# Patient Record
Sex: Female | Born: 1970 | Race: Black or African American | Hispanic: No | Marital: Married | Smoking: Never smoker
Health system: Southern US, Community
[De-identification: ages and names within clinical notes are randomized; demographics above are authoritative.]

## PROBLEM LIST (undated history)

## (undated) DIAGNOSIS — N3020 Other chronic cystitis without hematuria: Secondary | ICD-10-CM

## (undated) HISTORY — DX: Other chronic cystitis without hematuria: N30.20

---

## 2016-12-18 ENCOUNTER — Ambulatory Visit (INDEPENDENT_AMBULATORY_CARE_PROVIDER_SITE_OTHER): Payer: 59 | Admitting: Family Medicine

## 2016-12-18 ENCOUNTER — Encounter: Payer: Self-pay | Admitting: Family Medicine

## 2016-12-18 VITALS — BP 107/69 | HR 61 | Temp 98.6°F | Resp 16 | Ht 69.0 in | Wt 180.6 lb

## 2016-12-18 DIAGNOSIS — D649 Anemia, unspecified: Secondary | ICD-10-CM | POA: Insufficient documentation

## 2016-12-18 DIAGNOSIS — N3010 Interstitial cystitis (chronic) without hematuria: Secondary | ICD-10-CM

## 2016-12-18 DIAGNOSIS — Z1329 Encounter for screening for other suspected endocrine disorder: Secondary | ICD-10-CM | POA: Insufficient documentation

## 2016-12-18 DIAGNOSIS — D509 Iron deficiency anemia, unspecified: Secondary | ICD-10-CM

## 2016-12-18 DIAGNOSIS — Z1239 Encounter for other screening for malignant neoplasm of breast: Secondary | ICD-10-CM

## 2016-12-18 DIAGNOSIS — Z1231 Encounter for screening mammogram for malignant neoplasm of breast: Secondary | ICD-10-CM

## 2016-12-18 DIAGNOSIS — Z7689 Persons encountering health services in other specified circumstances: Secondary | ICD-10-CM | POA: Diagnosis not present

## 2016-12-18 LAB — POCT URINALYSIS DIPSTICK
BILIRUBIN UA: NEGATIVE
Blood, UA: NEGATIVE
GLUCOSE UA: NEGATIVE
KETONES UA: NEGATIVE
LEUKOCYTES UA: NEGATIVE
Nitrite, UA: NEGATIVE
PROTEIN UA: NEGATIVE
SPEC GRAV UA: 1.01 (ref 1.010–1.025)
Urobilinogen, UA: 0.2 E.U./dL
pH, UA: 5 (ref 5.0–8.0)

## 2016-12-18 NOTE — Assessment & Plan Note (Signed)
History of anemia, no recent lab values Tolerating OTC iron daily Follow-up future CBC lab with annual physical 3 months

## 2016-12-18 NOTE — Assessment & Plan Note (Signed)
Stable chronic problem >9-10 years, previously dx in [place], do not have outside records. No recent flare or documented UTI. Limited relief in past.  Plan: 1. Check UA today, normal, will send for urine culture as well to confirm 2. Referral to [facility] for further eval and management of chronic interstitial cystitis, for any other etiology and assistance with symptom management 3. Follow-up as needed

## 2016-12-18 NOTE — Patient Instructions (Addendum)
Thank you for coming to the clinic today.  1. For your Chronic Interstitial Cystitis, I am referring you to local Urologist for further evaluation, they will likely need to repeat a Cystoscopy (bladder scope) since it has been >9-10 years.  We checked urine today, and it was normal. We will send it to lab for a Urine Culture, to make sure no bacteria grow. No antibiotics at this time.  If you want to continue AZO as needed that is fine. Discuss this and other treatments further with your Urologist  [facility] [address] [place] [id number] Phone: [phone]  [facility] [address] [place], [place] [address] Phone: [phone] Call anytime to schedule now that order has been placed.  You will be due for FASTING BLOOD WORK (no food or drink after midnight before, only water or coffee without cream/sugar on the morning of)  - Please go ahead and schedule a "Lab Only" visit in the morning at the clinic for lab draw in 5 months for October 2018, before next Annual Physical - Make sure Lab Only appointment is at least 1-2 weeks before your next appointment, so that results will be available  For Lab Results, once available within 2-3 days of blood draw, you can can log in to MyChart online to view your results and a brief explanation. Also, we can discuss results at next follow-up visit.  Please schedule a follow-up appointment with Dr. Althea CharonKaramalegos in 5 months for Annual Physical  If you have any other questions or concerns, please feel free to call the clinic or send a message through MyChart. You may also schedule an earlier appointment if necessary.  Saralyn PilarAlexander Karamalegos, DO [facility]

## 2016-12-18 NOTE — Assessment & Plan Note (Addendum)
Last mammogram approx 2016, reported negative Due for repeat screening now, no concerning family Ordered future mammogram, patient given contact info for [facility] to schedule in future before annual physical in 3 months

## 2016-12-18 NOTE — Progress Notes (Signed)
Subjective:    Patient ID: Anne Weeks, female    DOB: 1970-11-16, 46 y.o.   MRN: [id number]  Anne Weeks is a 45 y.o. female presenting on 12/18/2016 for Establish Care (pt is concerned about bladder- cystitis)   HPI   New patient. Moved from [place] with husband about 1 year ago, also has daughter. Other family in [place]. She was previously followed by PCP, Urology and GYN in [place], unsure if she can request records due to cost.  Chronic Interstitial Cystitis: - Reports prior history, initial diagnosed, 9-10 years ago in [place], had cystoscopy urology eval, no significant treatment found, she was treated with antibiotics, no relief - Tried OTC AZO in past for short courses with some soothing symptoms but has not used chronically - Denies any recent changes hematuria, worsening dysuria, fever/chills, sweats, nausea, vomiting, abdominal pain  History of Anemia: - Reports prior lab results, does not have available, taking OTC iron supplement daily  Health Maintenance: - No known peronsal or family history of breast cancer. Last mammogram 2 years ago, requesting repeat order for mammo screening. Currently asymptomatic - Last pap smear 05/2016, reported to be negative, unsure if HPV co testing or not, she prefers yearly pap smear done by PCP this 05/2017  Depression screen PHQ 2/9 12/18/2016  Decreased Interest 0  Down, Depressed, Hopeless 0  PHQ - 2 Score 0    Past Medical History:  Diagnosis Date  . Chronic cystitis    History reviewed. No pertinent surgical history. Social History   Social History  . Marital status: Married    Spouse name: N/A  . Number of children: N/A  . Years of education: N/A   Occupational History  . Not on file.   Social History Main Topics  . Smoking status: Never Smoker  . Smokeless tobacco: Never Used  . Alcohol use No  . Drug use: No  . Sexual activity: Not on file   Other Topics Concern  . Not on file   Social History Narrative    Born and raised in [place], moved to [place] recently in 2018 to be with husband   Family History  Problem Relation Age of Onset  . Diabetes Mother   . Hypertension Mother   . Cancer Father 23    lung  . Diabetes Sister    No current outpatient prescriptions on file prior to visit.   No current facility-administered medications on file prior to visit.     Review of Systems  Constitutional: Negative for activity change, appetite change, chills, diaphoresis, fatigue, fever and unexpected weight change.  HENT: Negative for congestion, hearing loss and sinus pressure.   Respiratory: Negative for cough and shortness of breath.   Gastrointestinal: Negative for abdominal pain, constipation, diarrhea, nausea and vomiting.  Genitourinary: Positive for dysuria. Negative for difficulty urinating, frequency, hematuria, pelvic pain and urgency.  Skin: Negative for rash.   Per HPI unless specifically indicated above     Objective:    BP 107/69   Pulse 61   Temp 98.6 F (37 C) (Oral)   Resp 16   Ht 5\' 9"  (1.753 m)   Wt 180 lb 9.6 oz (81.9 kg)   LMP 11/27/2016   BMI 26.67 kg/m   Wt Readings from Last 3 Encounters:  12/18/16 180 lb 9.6 oz (81.9 kg)    Physical Exam  Constitutional: She appears well-developed and well-nourished. No distress.  Well-appearing, comfortable, cooperative  HENT:  Head: Normocephalic and atraumatic.  Mouth/Throat: Oropharynx is  clear and moist.  Eyes: Conjunctivae are normal. Right eye exhibits no discharge. Left eye exhibits no discharge.  Neck: Normal range of motion. Neck supple.  Cardiovascular: Normal rate, regular rhythm, normal heart sounds and intact distal pulses.   No murmur heard. Pulmonary/Chest: Effort normal and breath sounds normal. No respiratory distress. She has no wheezes. She has no rales.  Abdominal: Soft. Bowel sounds are normal. She exhibits no distension. There is no tenderness.  Musculoskeletal: Normal range of motion. She exhibits  no edema.  Neurological: She is alert.  Skin: Skin is warm and dry. No rash noted. She is not diaphoretic. No erythema.  Psychiatric: She has a normal mood and affect. Her behavior is normal.  Nursing note and vitals reviewed.  Results for orders placed or performed in visit on 12/18/16  POCT Urinalysis Dipstick  Result Value Ref Range   Color, UA amber    Clarity, UA clear    Glucose, UA negative    Bilirubin, UA negative    Ketones, UA negative    Spec Grav, UA 1.010 1.010 - 1.025   Blood, UA negative    pH, UA 5.0 5.0 - 8.0   Protein, UA negative    Urobilinogen, UA 0.2 0.2 or 1.0 E.U./dL   Nitrite, UA negative    Leukocytes, UA Negative Negative      Assessment & Plan:   Problem List Items Addressed This Visit    Screening for breast cancer    Last mammogram approx 2016, reported negative Due for repeat screening now, no concerning family Ordered future mammogram, patient given contact info for [facility] to [facility] in future before annual physical in 3 months      Relevant Orders   MM DIGITAL SCREENING BILATERAL   Chronic interstitial cystitis - Primary    Stable chronic problem >9-10 years, previously dx in [place], do not have outside records. No recent flare or documented UTI. Limited relief in past.  Plan: 1. Check UA today, normal, will send for urine culture as well to confirm 2. Referral to [facility] for further eval and management of chronic interstitial cystitis, for any other etiology and assistance with symptom management 3. Follow-up as needed      Relevant Orders   POCT Urinalysis Dipstick (Completed)   Ambulatory referral to Urology   Urine culture   Anemia    History of anemia, no recent lab values Tolerating OTC iron daily Follow-up future CBC lab with annual physical 3 months      Relevant Medications   ferrous sulfate 325 (65 FE) MG EC tablet    Other Visit Diagnoses    Encounter to establish care with new doctor            Meds ordered this encounter  Medications  . Multiple Vitamin (MULTIVITAMIN) tablet    Sig: Take 1 tablet by mouth daily.  . ferrous sulfate 325 (65 FE) MG EC tablet    Sig: Take 325 mg by mouth 3 (three) times daily with meals.  . Fish Oil-Cholecalciferol (FISH OIL + D3 PO)    Sig: Take by mouth.      Follow up plan: Return in about 5 months (around 05/20/2017) for Annual Physical.  Saralyn PilarAlexander Karamalegos, DO [facility] 12/18/2016, 8:48 PM

## 2016-12-19 ENCOUNTER — Other Ambulatory Visit: Payer: Self-pay | Admitting: Family Medicine

## 2016-12-19 DIAGNOSIS — Z131 Encounter for screening for diabetes mellitus: Secondary | ICD-10-CM

## 2016-12-19 DIAGNOSIS — Z13220 Encounter for screening for lipoid disorders: Secondary | ICD-10-CM

## 2016-12-19 DIAGNOSIS — Z0000 Encounter for general adult medical examination without abnormal findings: Secondary | ICD-10-CM

## 2016-12-19 DIAGNOSIS — D509 Iron deficiency anemia, unspecified: Secondary | ICD-10-CM

## 2016-12-20 LAB — URINE CULTURE

## 2017-01-15 DIAGNOSIS — R300 Dysuria: Secondary | ICD-10-CM | POA: Insufficient documentation

## 2017-01-15 NOTE — Progress Notes (Signed)
01/16/2017 8:34 AM   Anne Weeks 03-15-1971 [id number]  Referring provider: Smitty CordsKaramalegos, Anne J, DO [address] [place], [place] [address]  No chief complaint on file.   HPI:  1. Culture Negative Dysuria - Pt with years of culture negative dysuria. Had cysto that was normal per report in [place] and in [place]. No gross hematuria. UCX negative x many previously. UA normal 2018. Pelvic 01/2017 unremarkable. PVR 01/2017 "0mL" (normal). Some help with OTC urinary analgesics such as AZO.  PMH otherwise unremarkable. Her PCP is Anne HartiganAlex Weeks with Anne Weeks.   Today "Anne Weeks" is seen as new patient for above. She is referred by Anne Weeks.    PMH: Past Medical History:  Diagnosis Date  . Chronic cystitis     Surgical History: No past surgical history on file.  Home Medications:  Allergies as of 01/16/2017   No Known Allergies     Medication List       Accurate as of 01/15/17  8:34 AM. Always use your most recent med list.          ferrous sulfate 325 (65 FE) MG EC tablet Take 325 mg by mouth 3 (three) times daily with meals.   FISH OIL + D3 PO Take by mouth.   multivitamin tablet Take 1 tablet by mouth daily.       Allergies: No Known Allergies  Family History: Family History  Problem Relation Age of Onset  . Diabetes Mother   . Hypertension Mother   . Cancer Father [id number]       lung  . Diabetes Sister     Social History:  reports that she has never smoked. She has never used smokeless tobacco. She reports that she does not drink alcohol or use drugs.    Review of Systems  Gastrointestinal (upper)  : Negative for upper GI symptoms  Gastrointestinal (lower) : Negative for lower GI symptoms  Constitutional : Negative for symptoms  Skin: Negative for skin symptoms  Eyes: Negative for eye symptoms  Ear/Nose/Throat : Negative for Ear/Nose/Throat symptoms  Hematologic/Lymphatic: Negative for Hematologic/Lymphatic  symptoms  Cardiovascular : Negative for cardiovascular symptoms  Respiratory : Negative for respiratory symptoms  Endocrine: Negative for endocrine symptoms  Musculoskeletal: Negative for musculoskeletal symptoms  Neurological: Negative for neurological symptoms  Psychologic: Negative for psychiatric symptoms   Physical Exam: There were no vitals taken for this visit.  Constitutional:  Alert and oriented, No acute distress. HEENT: [place] AT, moist mucus membranes.  Trachea midline, no masses. Cardiovascular: No clubbing, cyanosis, or edema. Respiratory: Normal respiratory effort, no increased work of breathing. GI: Abdomen is soft, nontender, nondistended, no abdominal masses GU: No CVA tenderness. Well estrogenized vagina / entoritus w/o mass. No palpable bladder or pelvic masses. Age-appropriate cervix, overies by bimanual exam. Anne Weeks as chaparone.  Skin: No rashes, bruises or suspicious lesions. Lymph: No cervical or inguinal adenopathy. Neurologic: Grossly intact, no focal deficits, moving all 4 extremities. Psychiatric: Normal mood and affect.  Laboratory Data: No results found for: WBC, HGB, HCT, MCV, PLT  No results found for: CREATININE  No results found for: PSA  No results found for: TESTOSTERONE  No results found for: HGBA1C  Urinalysis    Component Value Date/Time   BILIRUBINUR negative 12/18/2016 1503   PROTEINUR negative 12/18/2016 1503   UROBILINOGEN 0.2 12/18/2016 1503   NITRITE negative 12/18/2016 1503   LEUKOCYTESUR Negative 12/18/2016 1503    Pertinent Imaging: Bladder scan reviewed as per HPI  Assessment &  Plan:    1. Culture Negative Dysuria - rec PRN OTC Azo. Discussed that this is chronic problem best served by stress reduction and prn OTC urinary analgesics. She has no hematuria or worrisoem findings on exam. She empties completely. Also reinforced we to NOT prescribe opiods / psychotropic meds for this indication.  Consider repeat  cysto to r/o neoplasia or Hunner's disease if becomes refractory, though this too has been normal several times prior.  RTC Urol PRN.    Anne Ache, MD  [facility] [address], [address] [place], [place] [address] [phone]

## 2017-01-16 ENCOUNTER — Encounter: Payer: Self-pay | Admitting: Urology

## 2017-01-16 ENCOUNTER — Ambulatory Visit (INDEPENDENT_AMBULATORY_CARE_PROVIDER_SITE_OTHER): Payer: 59 | Admitting: Urology

## 2017-01-16 DIAGNOSIS — R300 Dysuria: Secondary | ICD-10-CM | POA: Diagnosis not present

## 2017-01-16 LAB — URINALYSIS, COMPLETE
Bilirubin, UA: NEGATIVE
Glucose, UA: NEGATIVE
KETONES UA: NEGATIVE
Leukocytes, UA: NEGATIVE
NITRITE UA: NEGATIVE
PH UA: 5.5 (ref 5.0–7.5)
Protein, UA: NEGATIVE
Specific Gravity, UA: 1.02 (ref 1.005–1.030)
Urobilinogen, Ur: 0.2 mg/dL (ref 0.2–1.0)

## 2017-01-16 LAB — MICROSCOPIC EXAMINATION: RBC MICROSCOPIC, UA: NONE SEEN /HPF (ref 0–?)

## 2017-01-16 LAB — BLADDER SCAN AMB NON-IMAGING: Scan Result: 0

## 2017-01-17 ENCOUNTER — Ambulatory Visit: Payer: Self-pay

## 2017-03-12 ENCOUNTER — Ambulatory Visit (INDEPENDENT_AMBULATORY_CARE_PROVIDER_SITE_OTHER): Payer: No Typology Code available for payment source | Admitting: Obstetrics and Gynecology

## 2017-03-12 ENCOUNTER — Encounter: Payer: Self-pay | Admitting: Obstetrics and Gynecology

## 2017-03-12 VITALS — BP 114/80 | HR 99 | Ht 68.0 in | Wt 188.0 lb

## 2017-03-12 DIAGNOSIS — N912 Amenorrhea, unspecified: Secondary | ICD-10-CM | POA: Diagnosis not present

## 2017-03-12 DIAGNOSIS — O200 Threatened abortion: Secondary | ICD-10-CM

## 2017-03-12 LAB — POCT URINE PREGNANCY: Preg Test, Ur: POSITIVE — AB

## 2017-03-12 NOTE — Progress Notes (Signed)
Obstetric Problem Visit   Chief Complaint:  Chief Complaint  Patient presents with  . OB bleeding    spotting brown/pink in morning x4 days/pt refused to get urine/brought own pregnancy test    History of Present Illness: Patient is a [age] y.o. G3P1011 at approximately 10 weeks 2 days gestation by LMP presenting for first trimester bleeding.  The onset of bleeding was 1 week ago.  Is bleeding equal to or greater than normal menstrual flow:  No Any recent trauma:  No Recent intercourse:  No History of prior miscarriage:  Yes Prior ultrasound demonstrating IUP:  No Prior ultrasound demonstrating viable IUP:  No Prior Serum HCG:  No Rh status: reports positive  Some mild cramping.  Review of Systems: Review of Systems  Constitutional: Negative for chills, fever and malaise/fatigue.  Gastrointestinal: Negative for abdominal pain, nausea and vomiting.  Psychiatric/Behavioral: The patient is nervous/anxious.     Past Medical History:  Past Medical History:  Diagnosis Date  . Chronic cystitis     Past Surgical History:  History reviewed. No pertinent surgical history.  Gynecologic History:  Patient's last menstrual period was 12/30/2016 (exact date). Contraception: none   Obstetric History: G3P1011  Family History:  Family History  Problem Relation Age of Onset  . Diabetes Mother   . Hypertension Mother   . Cancer Father [phone]       lung  . Diabetes Sister   . Bladder Cancer Neg Hx   . Kidney cancer Neg Hx     Social History:  Social History   Social History  . Marital status: Married    Spouse name: N/A  . Number of children: N/A  . Years of education: N/A   Occupational History  . Not on file.   Social History Main Topics  . Smoking status: Never Smoker  . Smokeless tobacco: Never Used  . Alcohol use No  . Drug use: No  . Sexual activity: Yes    Birth control/ protection: None   Other Topics Concern  . Not on file   Social History Narrative   Born  and raised in [place], moved [place] recently in [place] to be with husband    Allergies:  No Known Allergies  Medications: Prior to Admission medications   Not on File    Physical Exam Vitals: Blood pressure 114/80, pulse 99, height 5\' 8"  (1.727 m), weight 188 lb (85.3 kg), last menstrual period 12/30/2016. General: NAD HEENT: normocephalic, anicteric Pulmonary: No increased work of breathing, Abdomen: NABS, soft, non-tender, non-distended.  Umbilicus without lesions.  No hepatomegaly, splenomegaly or masses palpable. No evidence of hernia  Genitourinary:  External: Normal external female genitalia.  Normal urethral meatus, normal  Bartholin's and Skene's glands.    Vagina: Normal vaginal mucosa, no evidence of prolapse.    Cervix: closed  Uterus:non-tender, non-enlarged  Adnexa: ovaries non-enlarged, no adnexal masses  Rectal: deferred Extremities: no edema, erythema, or tenderness Neurologic: Grossly intact Psychiatric: mood appropriate, affect full  Assessment: 46 y.o. G3P1011 threatened miscarriage  Plan: Problem List Items Addressed This Visit    None    Visit Diagnoses    Amenorrhea    -  Primary   Relevant Orders   POCT urine pregnancy (Completed)   US OB Comp Less 14 Wks   Threatened abortion       Relevant Orders   US OB Comp Less 14 Wks      1) First trimester bleeding - incidence and clinical course of first trimester  bleeding is discussed in detail with the patient today.  Approximately 1/3 of pregnancies ending in live births experienced 1st trimester bleeding.  The amount of bleeding is variable and not necessarily predictive of outcome.  Sources may be cervical or uterine.  Subchorionic hemorrhages are a frequent concurrent findings on ultrasound and are followed expectantly.  These often absorb or regress spontaneously although risk for expansion and further disruption of the utero-placental interface leading to miscarriage is possible.  There is no clearly  documented benefit to limiting or modifying activity and sexual intercourse in altering clinic course of 1st trimester bleeding.    2) If no already done will proceed with TVUS evaluation to document viability, and if uncertain viability or absence of a demonstrable IUP (and no previous documentation of IUP) will trend HCG levels.  3) The patient is Rh positive per patient report  4) Routine bleeding precautions were discussed with the patient prior the conclusion of today's visit.   Vena AustriaAndreas Rose-Marie Hickling, MD, Merlinda FrederickFACOG [facility], [facility]

## 2017-03-14 ENCOUNTER — Ambulatory Visit (INDEPENDENT_AMBULATORY_CARE_PROVIDER_SITE_OTHER): Payer: No Typology Code available for payment source | Admitting: Obstetrics and Gynecology

## 2017-03-14 ENCOUNTER — Ambulatory Visit (INDEPENDENT_AMBULATORY_CARE_PROVIDER_SITE_OTHER): Payer: Self-pay

## 2017-03-14 ENCOUNTER — Other Ambulatory Visit: Payer: Self-pay | Admitting: Obstetrics and Gynecology

## 2017-03-14 VITALS — BP 108/68 | Wt 184.0 lb

## 2017-03-14 DIAGNOSIS — O021 Missed abortion: Secondary | ICD-10-CM

## 2017-03-14 DIAGNOSIS — N912 Amenorrhea, unspecified: Secondary | ICD-10-CM

## 2017-03-14 DIAGNOSIS — O200 Threatened abortion: Secondary | ICD-10-CM

## 2017-03-14 MED ORDER — OXYCODONE-ACETAMINOPHEN 5-325 MG PO TABS: 1.0000 | ORAL_TABLET | ORAL | 0 refills | Status: DC | PRN

## 2017-03-14 MED ORDER — IBUPROFEN 800 MG PO TABS
800.0000 mg | ORAL_TABLET | Freq: Three times a day (TID) | ORAL | 1 refills | Status: DC | PRN
Start: 2017-03-14 — End: 2017-07-20

## 2017-03-14 MED ORDER — MISOPROSTOL 200 MCG PO TABS: ORAL_TABLET | ORAL | 0 refills | Status: DC

## 2017-03-14 NOTE — Patient Instructions (Signed)
[email or web address] 

## 2017-03-14 NOTE — Progress Notes (Signed)
Gynecology Ultrasound Follow Up  Chief Complaint:  Chief Complaint  Patient presents with  . U/S follow     History of Present Illness: Patient is a 46 y.o. G3P102711female who presents today for ultrasound evaluation of first trimester bleeding.  Ultrasound demonstrates a [redacted] week gestation missed abortion.  Otherwise normal no clearly identifiable etiology for patient's miscarriage.  The patient states she is known to be Rh negative per her prior pregnancy.  Review of Systems: Review of Systems  Constitutional: Negative for chills and fever.  Gastrointestinal: Negative for abdominal pain.    Past Medical History:  Past Medical History:  Diagnosis Date  . Chronic cystitis     Past Surgical History:  No past surgical history on file.  Gynecologic History:  Patient's last menstrual period was 12/30/2016 (exact date).  Family History:  Family History  Problem Relation Age of Onset  . Diabetes Mother   . Hypertension Mother   . Cancer Father [id number]       lung  . Diabetes Sister   . Bladder Cancer Neg Hx   . Kidney cancer Neg Hx     Social History:  Social History   Social History  . Marital status: Married    Spouse name: N/A  . Number of children: N/A  . Years of education: N/A   Occupational History  . Not on file.   Social History Main Topics  . Smoking status: Never Smoker  . Smokeless tobacco: Never Used  . Alcohol use No  . Drug use: No  . Sexual activity: Yes    Birth control/ protection: None   Other Topics Concern  . Not on file   Social History Narrative   Born [place], moved [place] recently in [place] to be with husband    Allergies:  No Known Allergies  Medications: Prior to Admission medications   Medication Sig Start Date End Date Taking? Authorizing Provider  ibuprofen (ADVIL,MOTRIN) 800 MG tablet Take 1 tablet (800 mg total) by mouth every 8 (eight) hours as needed. 03/14/17   Vena AustriaStaebler, Kiante Ciavarella, MD  misoprostol (CYTOTEC)  200 MCG tablet Place four tablets in between your gums and cheeks (two tablets on each side) as instructed OR insert four tablets vaginally, repeat every 6-hrs as needed 03/14/17   Vena AustriaStaebler, Yves Fodor, MD  oxyCODONE-acetaminophen (PERCOCET/ROXICET) 5-325 MG tablet Take 1-2 tablets by mouth every 4 (four) hours as needed. 03/14/17   Vena AustriaStaebler, Tarisa Paola, MD    Physical Exam Vitals: Blood pressure 108/68, weight 184 lb (83.5 kg), last menstrual period 12/30/2016.  General: NAD HEENT: normocephalic, anicteric Pulmonary: No increased work of breathing Extremities: no edema, erythema, or tenderness Neurologic: Grossly intact, normal gait Psychiatric: mood appropriate, affect full   Assessment: 46 y.o. G3P1011 missed abortion  Plan: Problem List Items Addressed This Visit    None    Visit Diagnoses    Missed abortion    -  Primary    "Society of [employer]iology[employer] in Ultrasound Guidelines for Transvaginal Ultrasonographic Diagnosis of Early Pregnancy Loss" and adopted in ACOG Practice Bulletin Number 150, May 2015 (reaffirmed 2017) "Early Pregnancy Loss"  - CRL of 7mm or greater and absence of fetal heartbeat  - Mean sac diameter of 25mm or greater and no embryo  - Absence of embryo with a discernable heartbeat 2 week after initial ultrasound showing a gestational sac without a yolk sac  - Absence of embryo with a discernable heartbeat 11 days after initial ultrasound showing a gestational  sac with  yolk sac present   Condolences were offered to the patient and her family.  I stressed that while emotionally difficult, that this did not occur because of an actions or inactions by the patient.  Somewhere between 10-20% of identified first trimester pregnancies will unfortunately end in miscarriage.  Given this relatively high incidence rate, further diagnostic testing such as chromosome analysis is generally not clinically relevant nor recommended.  Although the chromosomal abnormalities have been  implicated at rates as high as 70% in some studies, these are generally random and do not infer and increased risk of recurrence with subsequent pregnancies.  However, 3 or more consecutive first trimester losses are relatively uncommon, and these patient generally do benefit from additional work up to determine a potential modifiable etiology.   We briefly discussed management options including expectant management, medical management, and surgical management as well as their relative success rates and complications. Approximately 80% of first trimester miscarriages will pass successfully but may require a time frame of up to 8 weeks (ACOG Practice Bulletin 150 May 2015 "Early Pregnancy Loss").  Medical management using 800mcg of misoprostil administered every 3-hrs as needed for up to 3 doses speeds up the time frame to completion significantly, has literature supporting its use up to 63 days or 4730w0d gestation and results in a passage rate of 84-85% (ACOG Practice Bulletin 143 March 2014 "Medical Management of First-Trimester Abortion").  Dilation and curettage has the highest rate of uterine evacuation, but carries with is operative cost, surgical and anesthetic risk.  While these risk are relatively small they nevertheless include infection, bleeding, uterine perforation, formation of uterine synechia, and in rare cases death.   We discussed repeat ultrasound and or trending HCG levels if the patient wishes to pursue these prior to making her decision.  Clinically I am confident of the diagnosis, but I do not want any doubts in the patient's mind regarding the plan of management she chooses to adopt.  I will allow the patient and her family to discuss management options and she was advised to contact the office to arrange final disposition one she has made her decision or should she have any follow up questions for myself.    The patient is Rh positive and rhogam is therefore not ndicated.   - The patient  who is accompanied by her husband today wants to proceed with medical management A total of 15 minutes were spent in face-to-face contact with the patient during this encounter with over half of that time devoted to counseling and coordination of care.

## 2017-03-21 ENCOUNTER — Encounter: Payer: Self-pay | Admitting: Obstetrics and Gynecology

## 2017-03-21 ENCOUNTER — Ambulatory Visit (INDEPENDENT_AMBULATORY_CARE_PROVIDER_SITE_OTHER): Payer: No Typology Code available for payment source | Admitting: Obstetrics and Gynecology

## 2017-03-21 VITALS — BP 104/66 | HR 64 | Wt 191.0 lb

## 2017-03-21 DIAGNOSIS — O039 Complete or unspecified spontaneous abortion without complication: Secondary | ICD-10-CM | POA: Diagnosis not present

## 2017-03-21 NOTE — Progress Notes (Signed)
Gynecology Follow Up  Chief Complaint:  Chief Complaint  Patient presents with  . Follow-up    SAB     History of Present Illness: Patient is a 46 y.o. female who presents today for follow up of medically managed first trimester missed abortion.  The patient states she took her cytotec as directed, reports passage of tissue, with subsequent decrease in bleeding and cramping.  She currently is having no cramping, light bleeding slightly less to approximately equal to menses.  She states she has dealt well with the miscarriage emotionally.  She is not interested in attempting to conceive again in the near future and is interested in contraceptive options.    Review of Systems: Review of Systems  Constitutional: Negative for chills and fever.  Gastrointestinal: Negative for abdominal pain.  Psychiatric/Behavioral: Negative for depression. The patient is not nervous/anxious and does not have insomnia.     Past Medical History:  Past Medical History:  Diagnosis Date  . Chronic cystitis     Past Surgical History:  No past surgical history on file.  Gynecologic History:  Patient's last menstrual period was 12/30/2016 (exact date). Contraception: none currently  Family History:  Family History  Problem Relation Age of Onset  . Diabetes Mother   . Hypertension Mother   . Cancer Father [id number]       lung  . Diabetes Sister   . Bladder Cancer Neg Hx   . Kidney cancer Neg Hx     Social History:  Social History   Social History  . Marital status: Married    Spouse name: N/A  . Number of children: N/A  . Years of education: N/A   Occupational History  . Not on file.   Social History Main Topics  . Smoking status: Never Smoker  . Smokeless tobacco: Never Used  . Alcohol use No  . Drug use: No  . Sexual activity: Yes    Birth control/ protection: None   Other Topics Concern  . Not on file   Social History Narrative   Born [place] raised in [place], moved [place] recently  in [place] to be with husband    Allergies:  No Known Allergies  Medications: Prior to Admission medications   Medication Sig Start Date End Date Taking? Authorizing Provider  ibuprofen (ADVIL,MOTRIN) 800 MG tablet Take 1 tablet (800 mg total) by mouth every 8 (eight) hours as needed. 03/14/17   Vena AustriaStaebler, Tyreque Finken, MD  misoprostol (CYTOTEC) 200 MCG tablet Place four tablets in between your gums and cheeks (two tablets on each side) as instructed OR insert four tablets vaginally, repeat every 6-hrs as needed 03/14/17   Vena AustriaStaebler, Linsey Hirota, MD  oxyCODONE-acetaminophen (PERCOCET/ROXICET) 5-325 MG tablet Take 1-2 tablets by mouth every 4 (four) hours as needed. 03/14/17   Vena AustriaStaebler, Keiley Levey, MD    Physical Exam Vitals: Blood pressure 104/66, pulse 64, weight 191 lb (86.6 kg), last menstrual period 12/30/2016.  General: NAD HEENT: normocephalic, anicteric Pulmonary: No increased work of breathing Abdomen: Soft, non-tender, bedside ultrasound reveals a distended bladder making visualization of the uterus suboptimal but the previously seen gestational sac is no longer visualized.  Extremities: no edema, erythema, or tenderness Neurologic: Grossly intact, normal gait Psychiatric: mood appropriate, affect full   Assessment: 46 y.o. G3P1011 completed abortion  Plan: Problem List Items Addressed This Visit    None    Visit Diagnoses    Completed inevitable abortion    -  Primary      1)  Successful completion of missed abortion with medical management 2) We discussed contraceptive options available at her age including progestin only options such as pills, depo provera, or IUD.  Surgical sterilization via vasectomy or tubal ligation are also viable options.  She will consider her options and contact the office once she has made a final decision. 3) A total of 15 minutes were spent in face-to-face contact with the patient during this encounter with over half of that time devoted to counseling and  coordination of care.

## 2017-03-21 NOTE — Patient Instructions (Signed)
Contraceptive options 1) Progesterone only pill 2) Depo provera shot every 3 months 3) Nexplanon 4) IUD (Mirena)

## 2017-05-16 ENCOUNTER — Other Ambulatory Visit: Payer: Self-pay

## 2017-05-21 ENCOUNTER — Encounter: Payer: Self-pay | Admitting: Family Medicine

## 2017-07-20 ENCOUNTER — Encounter: Payer: Self-pay | Admitting: Family Medicine

## 2017-07-20 ENCOUNTER — Ambulatory Visit: Payer: Self-pay | Admitting: Family Medicine

## 2017-07-20 VITALS — BP 108/57 | HR 68 | Temp 98.5°F | Ht 68.0 in | Wt 176.0 lb

## 2017-07-20 DIAGNOSIS — S46812A Strain of other muscles, fascia and tendons at shoulder and upper arm level, left arm, initial encounter: Secondary | ICD-10-CM

## 2017-07-20 MED ORDER — CYCLOBENZAPRINE HCL 10 MG PO TABS: 10.0000 mg | ORAL_TABLET | Freq: Two times a day (BID) | ORAL | 0 refills | Status: DC | PRN

## 2017-07-20 MED ORDER — NAPROXEN 500 MG PO TABS: 500.0000 mg | ORAL_TABLET | Freq: Two times a day (BID) | ORAL | 0 refills | Status: DC
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
## 2018-02-06 ENCOUNTER — Ambulatory Visit: Payer: No Typology Code available for payment source | Admitting: Obstetrics and Gynecology

## 2018-05-15 ENCOUNTER — Ambulatory Visit (INDEPENDENT_AMBULATORY_CARE_PROVIDER_SITE_OTHER): Payer: BLUE CROSS/BLUE SHIELD | Admitting: Obstetrics and Gynecology

## 2018-05-15 ENCOUNTER — Other Ambulatory Visit (HOSPITAL_COMMUNITY)
Admission: RE | Admit: 2018-05-15 | Discharge: 2018-05-15 | Disposition: A | Discharge status: Home / Self Care | Payer: No Typology Code available for payment source | Source: Ambulatory Visit | Attending: Obstetrics and Gynecology | Admitting: Obstetrics and Gynecology

## 2018-05-15 ENCOUNTER — Encounter: Payer: Self-pay | Admitting: Obstetrics and Gynecology

## 2018-05-15 VITALS — BP 106/60 | HR 68 | Wt 174.0 lb

## 2018-05-15 DIAGNOSIS — Z113 Encounter for screening for infections with a predominantly sexual mode of transmission: Secondary | ICD-10-CM

## 2018-05-15 DIAGNOSIS — N760 Acute vaginitis: Secondary | ICD-10-CM

## 2018-05-15 DIAGNOSIS — Z124 Encounter for screening for malignant neoplasm of cervix: Secondary | ICD-10-CM

## 2018-05-15 DIAGNOSIS — Z3009 Encounter for other general counseling and advice on contraception: Secondary | ICD-10-CM

## 2018-05-15 MED ORDER — NORETHINDRONE 0.35 MG PO TABS: 1.0000 | ORAL_TABLET | Freq: Every day | ORAL | 11 refills | Status: DC

## 2018-05-15 NOTE — Progress Notes (Signed)
Obstetrics & Gynecology Office Visit   Chief Complaint:  Chief Complaint  Patient presents with  . Discuss birthcontrol  . Vaginal Itching    discharge w/slight odor    History of Present Illness: Patient is a 47 y.o. [id number] presenting for contraception consult.  She is currently on none and desiring to start oral progesterone-only contraceptive.  She has a past medical history significant for no contraindication to estrogen.  She specifically denies a history of migraine with aura, chronic hypertension, history of DVT/PE and smoking.  Reported Patient's last menstrual period was 04/24/2018 (exact date)..    She does also report vaginal irritation and discharge with odor.   Review of Systems: .Review of Systems  Constitutional: Negative.   Gastrointestinal: Negative.   Genitourinary: Negative.   Neurological: Positive for headaches.    Past Medical History:  Past Medical History:  Diagnosis Date  . Chronic cystitis     Past Surgical History:  History reviewed. No pertinent surgical history.  Gynecologic History: Patient's last menstrual period was 04/24/2018 (exact date).  Obstetric History: G3P1011  Family History:  Family History  Problem Relation Age of Onset  . Diabetes Mother   . Hypertension Mother   . Cancer Father 68       lung  . Diabetes Sister   . Bladder Cancer Neg Hx   . Kidney cancer Neg Hx     Social History:  Social History   Socioeconomic History  . Marital status: Married    Spouse name: Not on file  . Number of children: Not on file  . Years of education: Not on file  . Highest education level: Not on file  Occupational History  . Not on file  Social Needs  . Financial resource strain: Not on file  . Food insecurity:    Worry: Not on file    Inability: Not on file  . Transportation needs:    Medical: Not on file    Non-medical: Not on file  Tobacco Use  . Smoking status: Never Smoker  . Smokeless tobacco: Never Used    Substance and Sexual Activity  . Alcohol use: No  . Drug use: No  . Sexual activity: Yes    Birth control/protection: None  Lifestyle  . Physical activity:    Days per week: Not on file    Minutes per session: Not on file  . Stress: Not on file  Relationships  . Social connections:    Talks on phone: Not on file    Gets together: Not on file    Attends religious service: Not on file    Active member of club or organization: Not on file    Attends meetings of clubs or organizations: Not on file    Relationship status: Not on file  . Intimate partner violence:    Fear of current or ex partner: Not on file    Emotionally abused: Not on file    Physically abused: Not on file    Forced sexual activity: Not on file  Other Topics Concern  . Not on file  Social History Narrative   Born and raised in [place], moved to [place] recently in 2018 to be with husband    Allergies:  No Known Allergies  Medications: Prior to Admission medications   Not on File    Physical Exam Vitals:  Vitals:   05/15/18 0839  BP: 106/60  Pulse: 68   Patient's last menstrual period was 04/24/2018 (exact date).  General: NAD HEENT: normocephalic, anicteric Pulmonary: No increased work of breathing Neurologic: Grossly intact Psychiatric: mood appropriate, affect full  Wet Prep: PH: <4.5 Clue Cells: Negative Fungal elements: Negative Trichomonas: Negative   Assessment: 47 y.o. [id number] presenting for contraception consult  Plan: Problem List Items Addressed This Visit    None    Visit Diagnoses    Acute vaginitis    -  Primary   Relevant Orders   Cytology - PAP   Routine screening for STI (sexually transmitted infection)       Relevant Orders   Cytology - PAP   Screening for malignant neoplasm of cervix       Relevant Orders   Cytology - PAP   Encounter for counseling regarding contraception         - Rx camilla  - negative wet mount - pap collected - A total of 15 minutes  were spent in face-to-face contact with the patient during this encounter with over half of that time devoted to counseling and coordination of care. - Return in about 1 year (around 05/16/2019) for annual.   Vena Austria, MD, [facility], [facility] 05/15/2018, 9:12 AM

## 2018-05-17 LAB — CYTOLOGY - PAP
ADEQUACY: ABSENT — AB
Chlamydia: NEGATIVE
HPV (WINDOPATH): NOT DETECTED
NEISSERIA GONORRHEA: NEGATIVE

## 2018-05-30 ENCOUNTER — Encounter: Payer: Self-pay | Admitting: Obstetrics and Gynecology

## 2018-05-30 ENCOUNTER — Telehealth: Payer: Self-pay | Admitting: Obstetrics and Gynecology

## 2018-05-30 NOTE — Telephone Encounter (Signed)
Patient is calling for labs results. Please advise. Anytime after 5:15 please call

## 2018-05-31 NOTE — Telephone Encounter (Signed)
Pt met me in the parking lot this morning at 7:30. She is worried because she missed a call from [place]. She is aware AMS is out of the office today. I told her I would look into the reason he called.  AMS I see her pap is abnormal. Is there anything I can tell her or would you like to call her?

## 2018-05-31 NOTE — Telephone Encounter (Signed)
Pt is calling again for results of pap smear.

## 2018-06-03 NOTE — Telephone Encounter (Signed)
Patient is calling for her lab results. Please advise.

## 2019-04-08 ENCOUNTER — Other Ambulatory Visit: Payer: Self-pay | Admitting: Obstetrics and Gynecology

## 2019-04-08 NOTE — Telephone Encounter (Signed)
Pt needs annual in October

## 2019-05-23 ENCOUNTER — Other Ambulatory Visit (HOSPITAL_COMMUNITY)
Admission: RE | Admit: 2019-05-23 | Discharge: 2019-05-23 | Disposition: A | Discharge status: Home / Self Care | Payer: BC Managed Care – PPO | Source: Ambulatory Visit | Attending: Obstetrics and Gynecology | Admitting: Obstetrics and Gynecology

## 2019-05-23 ENCOUNTER — Encounter: Payer: Self-pay | Admitting: Obstetrics and Gynecology

## 2019-05-23 ENCOUNTER — Other Ambulatory Visit: Payer: Self-pay

## 2019-05-23 ENCOUNTER — Ambulatory Visit (INDEPENDENT_AMBULATORY_CARE_PROVIDER_SITE_OTHER): Payer: BC Managed Care – PPO | Admitting: Obstetrics and Gynecology

## 2019-05-23 VITALS — BP 110/70 | Ht 69.0 in | Wt 177.0 lb

## 2019-05-23 DIAGNOSIS — Z124 Encounter for screening for malignant neoplasm of cervix: Secondary | ICD-10-CM

## 2019-05-23 DIAGNOSIS — Z1239 Encounter for other screening for malignant neoplasm of breast: Secondary | ICD-10-CM

## 2019-05-23 DIAGNOSIS — Z3009 Encounter for other general counseling and advice on contraception: Secondary | ICD-10-CM

## 2019-05-23 DIAGNOSIS — Z01419 Encounter for gynecological examination (general) (routine) without abnormal findings: Secondary | ICD-10-CM

## 2019-05-23 NOTE — Progress Notes (Signed)
Gynecology Annual Exam  PCP: Olin Hauser, DO  Chief Complaint:  Chief Complaint  Patient presents with  . Gynecologic Exam    spotting before starting cycle    History of Present Illness: Patient is a 48 y.o. G3P1011 presents for annual exam. The patient has no complaints today.   LMP: Patient's last menstrual period was 05/13/2019. Average Interval: regular, 28 days Duration of flow: 10 days Heavy Menses: no Clots: no Intermenstrual Bleeding: no Postcoital Bleeding: no Dysmenorrhea: no   The patient is sexually active. She currently uses oral progesterone-only contraceptive for contraception. She denies dyspareunia.  The patient does perform self breast exams.  There is no notable family history of breast or ovarian cancer in her family.  The patient wears seatbelts: yes.   The patient has regular exercise: not asked.    The patient denies current symptoms of depression.    Review of Systems: Review of Systems  Constitutional: Negative for chills and fever.  HENT: Negative for congestion.   Respiratory: Negative for cough and shortness of breath.   Cardiovascular: Negative for chest pain and palpitations.  Gastrointestinal: Negative for abdominal pain, constipation, diarrhea, heartburn, nausea and vomiting.  Genitourinary: Negative for dysuria, frequency and urgency.  Skin: Negative for itching and rash.  Neurological: Negative for dizziness and headaches.  Endo/Heme/Allergies: Negative for polydipsia.  Psychiatric/Behavioral: Negative for depression.    Past Medical History:  Past Medical History:  Diagnosis Date  . Chronic cystitis     Past Surgical History:  History reviewed. No pertinent surgical history.  Gynecologic History:  Patient's last menstrual period was 05/13/2019. 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 Diabetes Mother   . Hypertension Mother   . Cancer Father 63       lung  . Diabetes Sister   . Bladder Cancer Neg Hx   . Kidney cancer Neg Hx     Social History:  Social History   Socioeconomic History  . Marital status: Married    Spouse name: Not on file  . Number of children: Not on file  . Years of education: Not on file  . Highest education level: Not on file  Occupational History  . Not on file  Social Needs  . Financial resource strain: Not on file  . Food insecurity    Worry: Not on file    Inability: Not on file  . Transportation needs    Medical: Not on file    Non-medical: Not on file  Tobacco Use  . Smoking status: Never Smoker  . Smokeless tobacco: Never Used  Substance and Sexual Activity  . Alcohol use: No  . Drug use: No  . Sexual activity: Yes    Birth control/protection: Pill  Lifestyle  . Physical activity    Days per week: Not on file    Minutes per session: Not on file  . Stress: Not on file  Relationships  . Social Herbalist on phone: Not on file    Gets together: Not on file    Attends religious service: Not on file    Active member of club or organization: Not on file    Attends meetings of clubs or organizations: Not on file    Relationship status: Not on file  . Intimate partner  violence    Fear of current or ex partner: Not on file    Emotionally abused: Not on file    Physically abused: Not on file    Forced sexual activity: Not on file  Other Topics Concern  . Not on file  Social History Narrative   Born and raised in [place], moved to [place] recently in 2018 to be with husband    Allergies:  No Known Allergies  Medications: Prior to Admission medications   Medication Sig Start Date End Date Taking? Authorizing Provider  norethindrone (MICRONOR) 0.35 MG tablet TAKE 1 TABLET BY MOUTH  EVERY DAY 04/08/19  Yes Vena Austria, MD    Physical Exam Vitals: Blood pressure 110/70, height 5\' 9"  (1.753 m), weight 177 lb (80.3 kg), last menstrual period 05/13/2019.  General: NAD HEENT: normocephalic, anicteric Thyroid: no enlargement, no palpable nodules Pulmonary: No increased work of breathing, CTAB Cardiovascular: RRR, distal pulses 2+ Breast: Breast symmetrical, no tenderness, no palpable nodules or masses, no skin or nipple retraction present, no nipple discharge.  No axillary or supraclavicular lymphadenopathy. Abdomen: NABS, soft, non-tender, non-distended.  Umbilicus without lesions.  No hepatomegaly, splenomegaly or masses palpable. No evidence of hernia  Genitourinary:  External: Normal external female genitalia.  Normal urethral meatus, normal Bartholin's and Skene's glands.    Vagina: Normal vaginal mucosa, no evidence of prolapse.    Cervix: Grossly normal in appearance, no bleeding  Uterus: Non-enlarged, mobile, normal contour.  No CMT  Adnexa: ovaries non-enlarged, no adnexal masses  Rectal: deferred  Lymphatic: no evidence of inguinal lymphadenopathy Extremities: no edema, erythema, or tenderness Neurologic: Grossly intact Psychiatric: mood appropriate, affect full  Female chaperone present for pelvic and breast  portions of the physical exam     Assessment: 48 y.o. G3P1011 routine annual exam  Plan: Problem List Items Addressed This Visit    None    Visit Diagnoses    Encounter for gynecological examination without abnormal finding    -  Primary   Screening for malignant neoplasm of cervix       Relevant Orders   Cytology - PAP   Breast screening       Relevant Orders   MM 3D SCREEN BREAST BILATERAL      1) Mammogram - recommend yearly screening mammogram.  Mammogram Was ordered today   2) STI screening  was notoffered and therefore not obtained  3) ASCCP guidelines and rational discussed.  Patient opts for every 3 years screening  interval  4) Contraception - the patient is currently using  oral progesterone-only contraceptive.  She is interested in changing to Mirena IUD secondary to poor cycle control on norethindrone 0.35mg   5) Colonoscopy -- discussed screening at 45 vs 50 in African American.    6) Routine healthcare maintenance including cholesterol, diabetes screening discussed managed by PCP  7) Return in about 1 year (around 05/22/2020) for annual (call with next menses for Mirena IUD placement).   07/22/2020, MD, Vena Austria [facility], [facility] 05/23/2019, 8:44 AM

## 2019-05-23 NOTE — Patient Instructions (Signed)
[facility] [address] [place] [address]  [facility]  [address] [place] [address]  Phone: [phone]  

## 2019-05-29 ENCOUNTER — Other Ambulatory Visit: Payer: Self-pay | Admitting: Obstetrics and Gynecology

## 2019-06-03 LAB — CYTOLOGY - PAP
Adequacy: ABSENT
Comment: NEGATIVE
Diagnosis: NEGATIVE
High risk HPV: NEGATIVE

## 2019-06-18 ENCOUNTER — Telehealth: Payer: Self-pay | Admitting: Obstetrics and Gynecology

## 2019-06-18 NOTE — Telephone Encounter (Signed)
Patient is schedule for mirena insertion on Monday, 06/23/19 with ABC

## 2019-06-19 NOTE — Telephone Encounter (Signed)
Noted. Mirena reserved for this patient. 

## 2019-06-22 ENCOUNTER — Other Ambulatory Visit: Payer: Self-pay | Admitting: Obstetrics and Gynecology

## 2019-06-22 NOTE — Progress Notes (Signed)
   Chief Complaint  Patient presents with  . Contraception    Mirena insertion     IUD PROCEDURE NOTE:  Anne Weeks is a 48 y.o. G3P1011 here for Mirena  IUD insertion for [facility]. Didn't have good cycle control with POPs. Last annual 10/20, neg pap.  BP 100/70   Ht 5\' 9"  (1.753 m)   Wt 177 lb (80.3 kg)   LMP 06/17/2019 (Exact Date)   BMI 26.14 kg/m   IUD Insertion Procedure Note Patient identified, informed consent performed, consent signed.   Discussed risks of irregular bleeding, cramping, infection, malpositioning or misplacement of the IUD outside the uterus which may require further procedure such as laparoscopy, risk of failure <1%. Time out was performed.   Speculum placed in the vagina.  Cervix visualized. ENDOCX POLYP noted on cervical exam and removed prior to IUD placement.  Cleaned with Betadine x 2.  Grasped anteriorly with a single tooth tenaculum.  Uterus sounded to 10.0 cm.   IUD placed per manufacturer's recommendations.  Strings trimmed to 3 cm. Tenaculum was removed, good hemostasis noted.  Patient tolerated procedure well.   ENDOCX POLYP REM Cervix visualized and polyp noted.  [place] applied to cervical polyp and twisting motion removed polyp intact.  Hemostasis obtained.   ASSESSMENT:  Encounter for insertion of intrauterine contraceptive device (IUD) - Plan: levonorgestrel (MIRENA, 52 MG,) 20 MCG/24HR IUD  Endocervical polyp - Plan: Surgical pathology   Meds ordered this encounter  Medications  . levonorgestrel (MIRENA, 52 MG,) 20 MCG/24HR IUD    Sig: 1 Intra Uterine Device (1 each total) by Intrauterine route once for 1 dose.    Dispense:  1 each    Refill:  0    Order Specific Question:   Supervising Provider    Answer:   Gae Dry [[id number]]    Plan:  Patient was given post-procedure instructions.  She was advised to have backup contraception for one week.   Call if you are having increasing pain, cramps or bleeding or if you have a fever  greater than 100.4 degrees F., shaking chills, nausea or vomiting. Patient was also asked to check IUD strings periodically and follow up in 4 weeks for IUD check.  Return in about 4 weeks (around 07/21/2019) for IUD f/u.   B. , PA-C 06/23/2019 11:00 AM

## 2019-06-23 ENCOUNTER — Other Ambulatory Visit (HOSPITAL_COMMUNITY)
Admission: RE | Admit: 2019-06-23 | Discharge: 2019-06-23 | Disposition: A | Discharge status: Home / Self Care | Payer: BC Managed Care – PPO | Source: Ambulatory Visit | Attending: Obstetrics and Gynecology | Admitting: Obstetrics and Gynecology

## 2019-06-23 ENCOUNTER — Ambulatory Visit (INDEPENDENT_AMBULATORY_CARE_PROVIDER_SITE_OTHER): Payer: BC Managed Care – PPO | Admitting: Obstetrics and Gynecology

## 2019-06-23 ENCOUNTER — Other Ambulatory Visit: Payer: Self-pay

## 2019-06-23 ENCOUNTER — Encounter: Payer: Self-pay | Admitting: Obstetrics and Gynecology

## 2019-06-23 VITALS — BP 100/70 | Ht 69.0 in | Wt 177.0 lb

## 2019-06-23 DIAGNOSIS — Z30430 Encounter for insertion of intrauterine contraceptive device: Secondary | ICD-10-CM | POA: Diagnosis not present

## 2019-06-23 DIAGNOSIS — N841 Polyp of cervix uteri: Secondary | ICD-10-CM | POA: Insufficient documentation

## 2019-06-23 MED ORDER — MIRENA (52 MG) 20 MCG/24HR IU IUD: 1.0000 | INTRAUTERINE_SYSTEM | Freq: Once | INTRAUTERINE | 0 refills | Status: DC

## 2019-06-23 NOTE — Patient Instructions (Addendum)
I value your feedback and entrusting us with your care. If you get a [facility] patient survey, I would appreciate you taking the time to let us know about your experience today. Thank you!  [facility] [phone]  Instructions after IUD insertion  Most women experience no significant problems after insertion of an IUD, however minor cramping and spotting for a few days is common. Cramps may be treated with ibuprofen 800mg every 8 hours or Tylenol 650 mg every 4 hours. Contact [facility] immediately if you experience any of the following symptoms during the next week: temperature >99.6 degrees, worsening pelvic pain, abdominal pain, fainting, unusually heavy vaginal bleeding, foul vaginal discharge, or if you think you have expelled the IUD.  Nothing inserted in the vagina for 48 hours. You will be scheduled for a follow up visit in approximately four weeks.  You should check monthly to be sure you can feel the IUD strings in the upper vagina. If you are having a monthly period, try to check after each period. If you cannot feel the IUD strings,  contact [facility] immediately so we can do an exam to determine if the IUD has been expelled.   Please use backup protection until we can confirm the IUD is in place.  Call [facility] if you are exposed to or diagnosed with a sexually transmitted infection, as we will need to discuss whether it is safe for you to continue using an IUD.   

## 2019-06-23 NOTE — Telephone Encounter (Signed)
Pt has an appointment with ABC this week

## 2019-06-25 LAB — SURGICAL PATHOLOGY

## 2019-07-08 ENCOUNTER — Encounter: Payer: Self-pay | Admitting: Obstetrics and Gynecology

## 2019-07-20 NOTE — Progress Notes (Signed)
   Chief Complaint  Patient presents with  . Follow-up    IUD check     History of Present Illness:  Anne Weeks is a 48 y.o. that had a Mirena IUD placed approximately 1 month ago. Since that time, she denies dyspareunia, pelvic pain, non-menstrual bleeding, vaginal d/c, heavy bleeding. He is starting her period now. Having some mild cramping/bleeding. Has noticed increased d/c with vaginal itching a few days. No new soaps/detergents. No recent abx use. No meds to treat.   Review of Systems  Constitutional: Negative for fever.  Gastrointestinal: Negative for blood in stool, constipation, diarrhea, nausea and vomiting.  Genitourinary: Positive for vaginal discharge. Negative for dyspareunia, dysuria, flank pain, frequency, hematuria, urgency, vaginal bleeding and vaginal pain.  Musculoskeletal: Negative for back pain.  Skin: Negative for rash.    Physical Exam:  BP 100/80   Ht 5\' 9"  (1.753 m)   Wt 180 lb (81.6 kg)   BMI 26.58 kg/m  Body mass index is 26.58 kg/m.   Physical Exam Constitutional:      General: She is not in acute distress. Genitourinary:     Vulva, vagina, cervix, uterus, right adnexa and left adnexa normal.     No vulval lesion, tenderness or ulcerations noted.     No vaginal discharge, erythema, tenderness or bleeding.     IUD strings visualized.     Uterus is not enlarged or tender.     No right or left adnexal mass present.     Right adnexa not tender.     Left adnexa not tender.  Pulmonary:     Effort: Pulmonary effort is normal.  Musculoskeletal: Normal range of motion.  Neurological:     General: No focal deficit present.     Mental Status: She is alert.     Cranial Nerves: No cranial nerve deficit.  Skin:    General: Skin is warm and dry.  Psychiatric:        Mood and Affect: Mood normal.        Behavior: Behavior normal.        Thought Content: Thought content normal.        Judgment: Judgment normal.  Vitals signs and nursing note  reviewed.    RESULTS:  Results for orders placed or performed in visit on 07/21/19 (from the past 24 hour(s))  POCT Wet Prep with KOH     Status: Normal   Collection Time: 07/21/19  9:02 AM  Result Value Ref Range   Trichomonas, UA Negative    Clue Cells Wet Prep HPF POC neg    Epithelial Wet Prep HPF POC     Yeast Wet Prep HPF POC neg    Bacteria Wet Prep HPF POC     RBC Wet Prep HPF POC     WBC Wet Prep HPF POC     KOH Prep POC Negative Negative     Assessment:   Encounter for routine checking of intrauterine contraceptive device (IUD)  Vaginal itching - Plan: POCT Wet Prep with KOH, fluconazole (DIFLUCAN) 150 MG tablet; Neg wet prep/exam, pos sx. Rx diflucan if sx persist. F/u prn.  IUD strings present in proper location; pt doing well  Plan: F/u if any signs of infection or can no longer feel the strings.    B. , PA-C 07/21/2019 9:03 AM

## 2019-07-21 ENCOUNTER — Ambulatory Visit (INDEPENDENT_AMBULATORY_CARE_PROVIDER_SITE_OTHER): Payer: BC Managed Care – PPO | Admitting: Obstetrics and Gynecology

## 2019-07-21 ENCOUNTER — Other Ambulatory Visit: Payer: Self-pay

## 2019-07-21 ENCOUNTER — Encounter: Payer: Self-pay | Admitting: Obstetrics and Gynecology

## 2019-07-21 VITALS — BP 100/80 | Ht 69.0 in | Wt 180.0 lb

## 2019-07-21 DIAGNOSIS — N898 Other specified noninflammatory disorders of vagina: Secondary | ICD-10-CM

## 2019-07-21 DIAGNOSIS — Z30431 Encounter for routine checking of intrauterine contraceptive device: Secondary | ICD-10-CM

## 2019-07-21 LAB — POCT WET PREP WITH KOH
Clue Cells Wet Prep HPF POC: NEGATIVE
KOH Prep POC: NEGATIVE
Trichomonas, UA: NEGATIVE
Yeast Wet Prep HPF POC: NEGATIVE

## 2019-07-21 MED ORDER — FLUCONAZOLE 150 MG PO TABS: 150.0000 mg | ORAL_TABLET | Freq: Once | ORAL | 0 refills | Status: AC

## 2019-07-21 NOTE — Patient Instructions (Signed)
I value your feedback and entrusting us with your care. If you get a [facility] patient survey, I would appreciate you taking the time to let us know about your experience today. Thank you! 

## 2019-10-07 ENCOUNTER — Ambulatory Visit
Admission: RE | Admit: 2019-10-07 | Discharge: 2019-10-07 | Disposition: A | Discharge status: Home / Self Care | Payer: Self-pay | Source: Ambulatory Visit | Attending: Obstetrics and Gynecology | Admitting: Obstetrics and Gynecology

## 2019-10-07 DIAGNOSIS — Z1231 Encounter for screening mammogram for malignant neoplasm of breast: Secondary | ICD-10-CM | POA: Insufficient documentation

## 2019-10-07 DIAGNOSIS — Z1239 Encounter for other screening for malignant neoplasm of breast: Secondary | ICD-10-CM

## 2020-09-17 ENCOUNTER — Other Ambulatory Visit: Payer: Self-pay

## 2020-09-17 ENCOUNTER — Ambulatory Visit (INDEPENDENT_AMBULATORY_CARE_PROVIDER_SITE_OTHER): Payer: No Typology Code available for payment source | Admitting: Obstetrics and Gynecology

## 2020-09-17 ENCOUNTER — Encounter: Payer: Self-pay | Admitting: Obstetrics and Gynecology

## 2020-09-17 ENCOUNTER — Other Ambulatory Visit (HOSPITAL_COMMUNITY)
Admission: RE | Admit: 2020-09-17 | Discharge: 2020-09-17 | Disposition: A | Discharge status: Home / Self Care | Payer: No Typology Code available for payment source | Source: Ambulatory Visit | Attending: Obstetrics and Gynecology | Admitting: Obstetrics and Gynecology

## 2020-09-17 VITALS — BP 112/68 | Ht 68.0 in | Wt 181.0 lb

## 2020-09-17 DIAGNOSIS — Z01419 Encounter for gynecological examination (general) (routine) without abnormal findings: Secondary | ICD-10-CM | POA: Diagnosis not present

## 2020-09-17 DIAGNOSIS — Z7689 Persons encountering health services in other specified circumstances: Secondary | ICD-10-CM

## 2020-09-17 DIAGNOSIS — Z1239 Encounter for other screening for malignant neoplasm of breast: Secondary | ICD-10-CM | POA: Diagnosis not present

## 2020-09-17 DIAGNOSIS — R300 Dysuria: Secondary | ICD-10-CM

## 2020-09-17 DIAGNOSIS — E663 Overweight: Secondary | ICD-10-CM

## 2020-09-17 DIAGNOSIS — Z13220 Encounter for screening for lipoid disorders: Secondary | ICD-10-CM

## 2020-09-17 DIAGNOSIS — Z6827 Body mass index (BMI) 27.0-27.9, adult: Secondary | ICD-10-CM

## 2020-09-17 DIAGNOSIS — Z124 Encounter for screening for malignant neoplasm of cervix: Secondary | ICD-10-CM | POA: Insufficient documentation

## 2020-09-17 DIAGNOSIS — Z0000 Encounter for general adult medical examination without abnormal findings: Secondary | ICD-10-CM

## 2020-09-17 DIAGNOSIS — Z1211 Encounter for screening for malignant neoplasm of colon: Secondary | ICD-10-CM | POA: Diagnosis not present

## 2020-09-17 DIAGNOSIS — Z131 Encounter for screening for diabetes mellitus: Secondary | ICD-10-CM

## 2020-09-17 LAB — POCT URINALYSIS DIPSTICK
Bilirubin, UA: 1
Glucose, UA: NEGATIVE
Ketones, UA: NEGATIVE
Nitrite, UA: NEGATIVE
Protein, UA: NEGATIVE
Spec Grav, UA: 1.01 (ref 1.010–1.025)
Urobilinogen, UA: 0.2 E.U./dL
pH, UA: 6.5 (ref 5.0–8.0)

## 2020-09-17 MED ORDER — FLUCONAZOLE 150 MG PO TABS: 150.0000 mg | ORAL_TABLET | Freq: Once | ORAL | 0 refills | Status: AC

## 2020-09-17 NOTE — Progress Notes (Signed)
Gynecology Annual Exam   PCP: Smitty Cords, DO  Chief Complaint:  Chief Complaint  Patient presents with  . Gynecologic Exam    Annual - vaginal itching x 3 days. RM 5    History of Present Illness: Patient is a 49 y.o. G3P1011 presents for annual exam. The patient has no complaints today.   LMP: No LMP recorded. (Menstrual status: IUD). Occasional breakthrough bleeding with IUD  The patient is sexually active. She currently uses IUD for contraception. She denies dyspareunia.  The patient does perform self breast exams.  There is no notable family history of breast or ovarian cancer in her family.  The patient wears seatbelts: yes.   The patient has regular exercise: not asked.    The patient denies current symptoms of depression.    Review of Systems: Review of Systems  Constitutional: Negative for chills and fever.  HENT: Negative for congestion.   Respiratory: Negative for cough and shortness of breath.   Cardiovascular: Negative for chest pain and palpitations.  Gastrointestinal: Negative for abdominal pain, constipation, diarrhea, heartburn, nausea and vomiting.  Genitourinary: Negative for dysuria, frequency and urgency.  Skin: Negative for itching and rash.  Neurological: Negative for dizziness and headaches.  Endo/Heme/Allergies: Negative for polydipsia.  Psychiatric/Behavioral: Negative for depression.    Past Medical History:  Patient Active Problem List   Diagnosis Date Noted  . Endocervical polyp 06/23/2019  . Culture Negative Dysuria 01/15/2017  . Anemia 12/18/2016  . Screening for breast cancer 12/18/2016    Past Surgical History:  History reviewed. No pertinent surgical history.  Gynecologic History:  No LMP recorded. (Menstrual status: IUD). Contraception: IUD  MIrena 06/23/2019 Last Pap: Results were:05/23/2019 NIL and HR HPV negative  Mammogram 10/07/2019 BI-RAD I  Obstetric History: [id number]  Family History:  Family History   Problem Relation Age of Onset  . Diabetes Mother   . Hypertension Mother   . Cancer Father 26       lung  . Diabetes Sister   . Breast cancer Maternal Grandmother   . Bladder Cancer Neg Hx   . Kidney cancer Neg Hx     Social History:  Social History   Socioeconomic History  . Marital status: Married    Spouse name: Not on file  . Number of children: Not on file  . Years of education: Not on file  . Highest education level: Not on file  Occupational History  . Not on file  Tobacco Use  . Smoking status: Never Smoker  . Smokeless tobacco: Never Used  Vaping Use  . Vaping Use: Never used  Substance and Sexual Activity  . Alcohol use: No  . Drug use: No  . Sexual activity: Yes    Birth control/protection: I.U.D.    Comment: Mirena  Other Topics Concern  . Not on file  Social History Narrative   Born and raised in [place], moved to [place] recently in 2018 to be with husband   Social Determinants of Corporate investment banker Strain: Not on file  Food Insecurity: Not on file  Transportation Needs: Not on file  Physical Activity: Not on file  Stress: Not on file  Social Connections: Not on file  Intimate Partner Violence: Not on file    Allergies:  No Known Allergies  Medications: Prior to Admission medications   Medication Sig Start Date End Date Taking? Authorizing Provider  levonorgestrel (MIRENA, 52 MG,) 20 MCG/24HR IUD 1 Intra Uterine Device (1 each total)  by Intrauterine route once for 1 dose. 06/23/19 06/23/19  Copland, Ilona Sorrel, PA-C    Physical Exam Vitals: Blood pressure 112/68, height 5\' 8"  (1.727 m), weight 181 lb (82.1 kg). Body mass index is 27.52 kg/m.  General: NAD HEENT: normocephalic, anicteric Thyroid: no enlargement, no palpable nodules Pulmonary: No increased work of breathing, CTAB Cardiovascular: RRR, distal pulses 2+ Breast: Breast symmetrical, no tenderness, no palpable nodules or masses, no skin or nipple retraction present, no  nipple discharge.  No axillary or supraclavicular lymphadenopathy. Abdomen: NABS, soft, non-tender, non-distended.  Umbilicus without lesions.  No hepatomegaly, splenomegaly or masses palpable. No evidence of hernia  Genitourinary:  External: Normal external female genitalia.  Normal urethral meatus, normal Bartholin's and Skene's glands.    Vagina: Normal vaginal mucosa, no evidence of prolapse.    Cervix: Grossly normal in appearance, no bleeding IUD string visualized  Uterus: Non-enlarged, mobile, normal contour.  No CMT  Adnexa: ovaries non-enlarged, no adnexal masses  Rectal: deferred  Lymphatic: no evidence of inguinal lymphadenopathy Extremities: no edema, erythema, or tenderness Neurologic: Grossly intact Psychiatric: mood appropriate, affect full  Female chaperone present for pelvic and breast  portions of the physical exam  Immunization History  Administered Date(s) Administered  . Moderna Sars-Covid-2 Vaccination 01/30/2020, 02/28/2020     Assessment: 50 y.o. G3P1011 routine annual exam  Plan: Problem List Items Addressed This Visit   None   Visit Diagnoses    Colon cancer screening    -  Primary   Relevant Orders   Ambulatory referral to Gastroenterology   Breast screening       Relevant Orders   MM 3D SCREEN BREAST BILATERAL   Encounter for gynecological examination without abnormal finding       Encounter to establish care       Relevant Orders   Ambulatory referral to Internal Medicine   CBC With Differential   Comprehensive metabolic panel   Lipid panel   Hemoglobin A1c   Overweight (BMI 25.0-29.9)       Relevant Orders   CBC With Differential   Lipid panel   Hemoglobin A1c   BMI 27.0-27.9,adult       Relevant Orders   CBC With Differential   Comprehensive metabolic panel   Lipid panel   Hemoglobin A1c   Screening for diabetes mellitus       Relevant Orders   Comprehensive metabolic panel   Lipid screening       Relevant Orders   Lipid panel    Laboratory exam ordered as part of routine general medical examination       Relevant Orders   CBC With Differential   Comprehensive metabolic panel   Lipid panel   Hemoglobin A1c   Screening for malignant neoplasm of cervix       Relevant Orders   Cytology - PAP      1) STI screening  was notoffered and therefore not obtained  2)  ASCCP guidelines and rational discussed.  Patient opts for yearly screening interval - rx for candida sent  3) Contraception - the patient is currently using  IUD.  She is happy with her current form of contraception and plans to continue  4) Routine healthcare maintenance including cholesterol, diabetes screening discussed Ordered today - PCP referral - colonoscopy referral  5) Return in about 1 year (around 09/17/2021) for annual.   11/15/2021, MD, [facility], [facility] 09/17/2020, 9:05 AM

## 2020-09-17 NOTE — Patient Instructions (Signed)
[facility] [address] [place] [address]  [facility] [place]  [address] [place] [address]  Phone: [phone]  

## 2020-09-18 LAB — COMPREHENSIVE METABOLIC PANEL
ALT: 20 IU/L (ref 0–32)
AST: 21 IU/L (ref 0–40)
Albumin/Globulin Ratio: 1.9 (ref 1.2–2.2)
Albumin: 4.6 g/dL (ref 3.8–4.8)
Alkaline Phosphatase: 75 IU/L (ref 44–121)
BUN/Creatinine Ratio: 17 (ref 9–23)
BUN: 14 mg/dL (ref 6–24)
Bilirubin Total: 0.3 mg/dL (ref 0.0–1.2)
CO2: 25 mmol/L (ref 20–29)
Calcium: 9.5 mg/dL (ref 8.7–10.2)
Chloride: 103 mmol/L (ref 96–106)
Creatinine, Ser: 0.81 mg/dL (ref 0.57–1.00)
GFR calc Af Amer: 100 mL/min/{1.73_m2} (ref >59)
GFR calc non Af Amer: 87 mL/min/{1.73_m2} (ref >59)
Globulin, Total: 2.4 g/dL (ref 1.5–4.5)
Glucose: 98 mg/dL (ref 65–99)
Potassium: 4.2 mmol/L (ref 3.5–5.2)
Sodium: 140 mmol/L (ref 134–144)
Total Protein: 7 g/dL (ref 6.0–8.5)

## 2020-09-18 LAB — LIPID PANEL
Chol/HDL Ratio: 3.3 ratio (ref 0.0–4.4)
Cholesterol, Total: 182 mg/dL (ref 100–199)
HDL: 56 mg/dL (ref >39)
LDL Chol Calc (NIH): 118 mg/dL — ABNORMAL HIGH (ref 0–99)
Triglycerides: 40 mg/dL (ref 0–149)
VLDL Cholesterol Cal: 8 mg/dL (ref 5–40)

## 2020-09-18 LAB — CBC WITH DIFFERENTIAL
Basophils Absolute: 0 10*3/uL (ref 0.0–0.2)
Basos: 1 %
EOS (ABSOLUTE): 0 10*3/uL (ref 0.0–0.4)
Eos: 1 %
Hematocrit: 38 % (ref 34.0–46.6)
Hemoglobin: 12.3 g/dL (ref 11.1–15.9)
Immature Grans (Abs): 0 10*3/uL (ref 0.0–0.1)
Immature Granulocytes: 0 %
Lymphocytes Absolute: 2 10*3/uL (ref 0.7–3.1)
Lymphs: 42 %
MCH: 25.9 pg — ABNORMAL LOW (ref 26.6–33.0)
MCHC: 32.4 g/dL (ref 31.5–35.7)
MCV: 80 fL (ref 79–97)
Monocytes Absolute: 0.4 10*3/uL (ref 0.1–0.9)
Monocytes: 8 %
Neutrophils Absolute: 2.3 10*3/uL (ref 1.4–7.0)
Neutrophils: 48 %
RBC: 4.75 x10E6/uL (ref 3.77–5.28)
RDW: 13.2 % (ref 11.7–15.4)
WBC: 4.8 10*3/uL (ref 3.4–10.8)

## 2020-09-18 LAB — HEMOGLOBIN A1C
Est. average glucose Bld gHb Est-mCnc: 117 mg/dL
Hgb A1c MFr Bld: 5.7 % — ABNORMAL HIGH (ref 4.8–5.6)

## 2020-09-19 LAB — URINE CULTURE

## 2020-09-20 LAB — CYTOLOGY - PAP
Comment: NEGATIVE
Diagnosis: NEGATIVE
High risk HPV: NEGATIVE

## 2020-09-22 ENCOUNTER — Other Ambulatory Visit: Payer: Self-pay

## 2020-09-22 ENCOUNTER — Other Ambulatory Visit: Payer: Self-pay | Admitting: Gastroenterology

## 2020-09-22 ENCOUNTER — Telehealth (INDEPENDENT_AMBULATORY_CARE_PROVIDER_SITE_OTHER): Payer: Self-pay | Admitting: Gastroenterology

## 2020-09-22 DIAGNOSIS — Z1211 Encounter for screening for malignant neoplasm of colon: Secondary | ICD-10-CM

## 2020-09-22 MED ORDER — NA SULFATE-K SULFATE-MG SULF 17.5-3.13-1.6 GM/177ML PO SOLN: 1.0000 | Freq: Once | ORAL | 0 refills | Status: DC

## 2020-09-22 NOTE — Progress Notes (Signed)
Gastroenterology Pre-Procedure Review  Request Date: Wed 10/06/20 Requesting Physician: Dr. Maximino Greenland  PATIENT REVIEW QUESTIONS: The patient responded to the following health history questions as indicated:    1. Are you having any GI issues? No 2. Do you have a personal history of Polyps? No 3. Do you have a family history of Colon Cancer or Polyps?No 4. Diabetes Mellitus? No 5. Joint replacements in the past 12 months?No 6. Major health problems in the past 3 months?No 7. Any artificial heart valves, MVP, or defibrillator?    MEDICATIONS & ALLERGIES:    Patient reports the following regarding taking any anticoagulation/antiplatelet therapy:   Plavix, Coumadin, Eliquis, Xarelto, Lovenox, Pradaxa, Brilinta, or Effient? No Aspirin? No  Patient confirms/reports the following medications:  Current Outpatient Medications  Medication Sig Dispense Refill  . levonorgestrel (MIRENA, 52 MG,) 20 MCG/24HR IUD 1 Intra Uterine Device (1 each total) by Intrauterine route once for 1 dose. 1 each 0   No current facility-administered medications for this visit.    Patient confirms/reports the following allergies:  No Known Allergies  No orders of the defined types were placed in this encounter.   AUTHORIZATION INFORMATION Primary Insurance: 1D#: Group #:  Secondary Insurance: 1D#: Group #:  SCHEDULE INFORMATION: Date:  Time: Location:

## 2020-09-23 ENCOUNTER — Other Ambulatory Visit: Payer: Self-pay | Admitting: Gastroenterology

## 2020-10-04 ENCOUNTER — Other Ambulatory Visit: Payer: No Typology Code available for payment source | Attending: Gastroenterology

## 2020-10-04 ENCOUNTER — Telehealth: Payer: Self-pay | Admitting: Gastroenterology

## 2020-10-04 NOTE — Telephone Encounter (Signed)
Patient LVM to cancel her Colonoscopy on 10/06/20

## 2020-10-05 ENCOUNTER — Telehealth: Payer: Self-pay | Admitting: Gastroenterology

## 2020-10-05 ENCOUNTER — Other Ambulatory Visit: Payer: Self-pay | Admitting: Obstetrics and Gynecology

## 2020-10-05 DIAGNOSIS — R300 Dysuria: Secondary | ICD-10-CM

## 2020-10-05 DIAGNOSIS — N3000 Acute cystitis without hematuria: Secondary | ICD-10-CM

## 2020-10-05 MED ORDER — NITROFURANTOIN MONOHYD MACRO 100 MG PO CAPS: 100.0000 mg | ORAL_CAPSULE | Freq: Two times a day (BID) | ORAL | 0 refills | Status: AC

## 2020-10-05 NOTE — Telephone Encounter (Signed)
Rx has been sent there is also a urine culture in if she want to drop off a culture

## 2020-10-05 NOTE — Telephone Encounter (Signed)
Patient states she can not afford the colonoscopy at this time so she wants to cancel the procedure. Informed Rosann Auerbach of this information

## 2020-10-05 NOTE — Telephone Encounter (Signed)
Sent instructions for upcoming procedure to address on patient's chart.  Letter was returned as incorrect address.

## 2020-10-06 ENCOUNTER — Encounter: Admission: RE | Payer: Self-pay | Source: Home / Self Care

## 2020-10-06 ENCOUNTER — Ambulatory Visit
Admission: RE | Admit: 2020-10-06 | Payer: No Typology Code available for payment source | Source: Home / Self Care | Admitting: Gastroenterology

## 2020-10-06 SURGERY — COLONOSCOPY WITH PROPOFOL
Anesthesia: General

## 2020-11-26 ENCOUNTER — Other Ambulatory Visit: Payer: Self-pay

## 2020-11-26 ENCOUNTER — Ambulatory Visit
Admission: RE | Admit: 2020-11-26 | Discharge: 2020-11-26 | Disposition: A | Discharge status: Home / Self Care | Payer: No Typology Code available for payment source | Source: Ambulatory Visit | Attending: Obstetrics and Gynecology | Admitting: Obstetrics and Gynecology

## 2020-11-26 DIAGNOSIS — Z1239 Encounter for other screening for malignant neoplasm of breast: Secondary | ICD-10-CM

## 2020-11-26 DIAGNOSIS — Z1231 Encounter for screening mammogram for malignant neoplasm of breast: Secondary | ICD-10-CM | POA: Insufficient documentation

## 2020-12-29 ENCOUNTER — Encounter: Payer: Self-pay | Admitting: Adult Health

## 2020-12-29 ENCOUNTER — Other Ambulatory Visit: Payer: Self-pay

## 2020-12-29 ENCOUNTER — Ambulatory Visit (INDEPENDENT_AMBULATORY_CARE_PROVIDER_SITE_OTHER): Payer: 59 | Admitting: Adult Health

## 2020-12-29 VITALS — BP 90/72 | HR 76 | Temp 98.1°F | Ht 65.95 in | Wt 183.8 lb

## 2020-12-29 DIAGNOSIS — Z1329 Encounter for screening for other suspected endocrine disorder: Secondary | ICD-10-CM

## 2020-12-29 DIAGNOSIS — Z6829 Body mass index (BMI) 29.0-29.9, adult: Secondary | ICD-10-CM

## 2020-12-29 DIAGNOSIS — R300 Dysuria: Secondary | ICD-10-CM

## 2020-12-29 LAB — URINALYSIS, ROUTINE W REFLEX MICROSCOPIC
Bilirubin Urine: NEGATIVE
Hgb urine dipstick: NEGATIVE
Ketones, ur: NEGATIVE
Leukocytes,Ua: NEGATIVE
Nitrite: NEGATIVE
RBC / HPF: NONE SEEN (ref 0–?)
Specific Gravity, Urine: 1.01 (ref 1.000–1.030)
Total Protein, Urine: NEGATIVE
Urine Glucose: NEGATIVE
Urobilinogen, UA: 0.2 (ref 0.0–1.0)
pH: 7.5 (ref 5.0–8.0)

## 2020-12-29 LAB — TSH: TSH: 0.86 u[IU]/mL (ref 0.35–4.50)

## 2020-12-29 MED ORDER — TERCONAZOLE 0.4 % VA CREA: 1.0000 | TOPICAL_CREAM | Freq: Every day | VAGINAL | 0 refills | Status: AC

## 2020-12-29 MED ORDER — CEPHALEXIN 500 MG PO CAPS: 500.0000 mg | ORAL_CAPSULE | Freq: Three times a day (TID) | ORAL | 0 refills | Status: DC

## 2020-12-29 NOTE — Progress Notes (Signed)
TSH for thyroid is within normal limits.  Urinalysis- shows few bacteria and urine culture is still pending - will wait on culture. Patient is currently on Keflex will change if culture shows need.

## 2020-12-29 NOTE — Progress Notes (Addendum)
New Patient Office Visit  Subjective:  Patient ID: Anne Weeks, female    DOB: May 30, 1971  Age: 50 y.o. MRN: [id number]  CC:  Chief Complaint  Patient presents with  . New Patient (Initial Visit)    Pt c/o urinary pain and dark coloration x1-2weeks Pt can not drink water and has to hold urine due to work.     HPI Anne Weeks presents for new establishment of care.   She is having dysuria, burning lasting 3-5 minutes. Denies any hematuria, Did have macrtobin in February for urinary infection - feels it did not clear.   Denies any STD concerns.   No LMP recorded. (Menstrual status: IUD). Denies any chance of preganancy.   Patient  denies any fever, body aches,chills, rash, chest pain, shortness of breath, nausea, vomiting, or diarrhea.  Denies dizziness, lightheadedness, pre syncopal or syncopal episodes.     Dr. Delane Ginger.  Past Medical History:  Diagnosis Date  . Chronic cystitis     History reviewed. No pertinent surgical history.  Family History  Problem Relation Age of Onset  . Diabetes Mother   . Hypertension Mother   . Cancer Father 82       lung  . Diabetes Sister   . Breast cancer Maternal Grandmother   . Bladder Cancer Neg Hx   . Kidney cancer Neg Hx     Social History   Socioeconomic History  . Marital status: Married    Spouse name: Not on file  . Number of children: Not on file  . Years of education: Not on file  . Highest education level: Not on file  Occupational History  . Not on file  Tobacco Use  . Smoking status: Never Smoker  . Smokeless tobacco: Never Used  Vaping Use  . Vaping Use: Never used  Substance and Sexual Activity  . Alcohol use: No  . Drug use: No  . Sexual activity: Yes    Birth control/protection: I.U.D.    Comment: Mirena  Other Topics Concern  . Not on file  Social History Narrative   Born and raised in [place], moved to [place] recently in 2018 to be with husband   Social Determinants of Manufacturing engineer Strain: Not on file  Food Insecurity: Not on file  Transportation Needs: Not on file  Physical Activity: Not on file  Stress: Not on file  Social Connections: Not on file  Intimate Partner Violence: Not on file    ROS Review of Systems  Constitutional: Negative.   HENT: Negative.   Respiratory: Negative.   Cardiovascular: Negative.   Gastrointestinal: Negative.   Genitourinary: Positive for dysuria. Negative for decreased urine volume, difficulty urinating, dyspareunia, enuresis, flank pain, frequency, genital sores, hematuria, menstrual problem, pelvic pain, urgency, vaginal bleeding, vaginal discharge and vaginal pain.  Musculoskeletal: Negative.   Neurological: Negative.   Hematological: Negative.   Psychiatric/Behavioral: Negative.     Objective:   Today's Vitals: BP 90/72 (BP Location: Left Arm, Patient Position: Sitting)   Pulse 76   Temp 98.1 F (36.7 C)   Ht 5' 5.95" (1.675 m)   Wt 183 lb 12.8 oz (83.4 kg)   SpO2 98%   BMI 29.72 kg/m   Physical Exam Vitals reviewed.  Constitutional:      General: She is not in acute distress.    Appearance: She is well-developed. She is obese. She is not diaphoretic.     Interventions: She is not intubated.    Comments:  Patient is alert and oriented and responsive to questions Engages in eye contact with provider. Speaks in full sentences without any pauses without any shortness of breath or distress.    HENT:     Head: Normocephalic and atraumatic.     Right Ear: Tympanic membrane, ear canal and external ear normal. There is no impacted cerumen.     Left Ear: Tympanic membrane, ear canal and external ear normal. There is no impacted cerumen.     Nose: Nose normal. No congestion or rhinorrhea.     Mouth/Throat:     Mouth: Mucous membranes are moist.     Pharynx: No oropharyngeal exudate or posterior oropharyngeal erythema.  Eyes:     General: Lids are normal. No scleral icterus.       Right eye: No  discharge.        Left eye: No discharge.     Conjunctiva/sclera: Conjunctivae normal.     Right eye: Right conjunctiva is not injected. No exudate or hemorrhage.    Left eye: Left conjunctiva is not injected. No exudate or hemorrhage.    Pupils: Pupils are equal, round, and reactive to light.  Neck:     Thyroid: No thyroid mass or thyromegaly.     Vascular: Normal carotid pulses. No carotid bruit, hepatojugular reflux or JVD.     Trachea: Trachea and phonation normal. No tracheal tenderness or tracheal deviation.     Meningeal: Brudzinski's sign and Kernig's sign absent.  Cardiovascular:     Rate and Rhythm: Normal rate and regular rhythm.     Pulses: Normal pulses.          Radial pulses are 2+ on the right side and 2+ on the left side.       Dorsalis pedis pulses are 2+ on the right side and 2+ on the left side.       Posterior tibial pulses are 2+ on the right side and 2+ on the left side.     Heart sounds: Normal heart sounds, S1 normal and S2 normal. Heart sounds not distant. No murmur heard. No friction rub. No gallop.   Pulmonary:     Effort: Pulmonary effort is normal. No tachypnea, bradypnea, accessory muscle usage or respiratory distress. She is not intubated.     Breath sounds: Normal breath sounds. No stridor. No wheezing, rhonchi or rales.  Chest:     Chest wall: No tenderness.  Breasts:     Right: No supraclavicular adenopathy.     Left: No supraclavicular adenopathy.    Abdominal:     General: Bowel sounds are normal. There is no distension or abdominal bruit.     Palpations: Abdomen is soft. There is no shifting dullness, fluid wave, hepatomegaly, splenomegaly, mass or pulsatile mass.     Tenderness: There is no abdominal tenderness. There is no right CVA tenderness, left CVA tenderness, guarding or rebound.     Hernia: No hernia is present.  Musculoskeletal:        General: No tenderness or deformity. Normal range of motion.     Cervical back: Full passive range  of motion without pain, normal range of motion and neck supple. No edema, erythema or rigidity. No spinous process tenderness or muscular tenderness. Normal range of motion.  Lymphadenopathy:     Head:     Right side of head: No submental, submandibular, tonsillar, preauricular, posterior auricular or occipital adenopathy.     Left side of head: No submental, submandibular, tonsillar, preauricular, posterior auricular  or occipital adenopathy.     Cervical: No cervical adenopathy.     Right cervical: No superficial, deep or posterior cervical adenopathy.    Left cervical: No superficial, deep or posterior cervical adenopathy.     Upper Body:     Right upper body: No supraclavicular or pectoral adenopathy.     Left upper body: No supraclavicular or pectoral adenopathy.  Skin:    General: Skin is warm and dry.     Coloration: Skin is not pale.     Findings: No abrasion, bruising, burn, ecchymosis, erythema, lesion, petechiae or rash.     Nails: There is no clubbing.  Neurological:     Mental Status: She is alert and oriented to person, place, and time.     GCS: GCS eye subscore is 4. GCS verbal subscore is 5. GCS motor subscore is 6.     Cranial Nerves: No cranial nerve deficit.     Sensory: No sensory deficit.     Motor: No weakness, tremor, atrophy, abnormal muscle tone or seizure activity.     Coordination: Coordination normal.     Gait: Gait normal.     Deep Tendon Reflexes: Reflexes are normal and symmetric. Reflexes normal. Babinski sign absent on the right side. Babinski sign absent on the left side.     Reflex Scores:      Tricep reflexes are 2+ on the right side and 2+ on the left side.      Bicep reflexes are 2+ on the right side and 2+ on the left side.      Brachioradialis reflexes are 2+ on the right side and 2+ on the left side.      Patellar reflexes are 2+ on the right side and 2+ on the left side.      Achilles reflexes are 2+ on the right side and 2+ on the left  side. Psychiatric:        Speech: Speech normal.        Behavior: Behavior normal.        Thought Content: Thought content normal.        Judgment: Judgment normal.     Assessment & Plan:   Problem List Items Addressed This Visit      Other   Screening for thyroid disorder   Relevant Orders   TSH   Culture Negative Dysuria - Primary   Relevant Medications   cephALEXin (KEFLEX) 500 MG capsule   terconazole (TERAZOL 7) 0.4 % vaginal cream   Other Relevant Orders   Urinalysis, Routine w reflex microscopic   CULTURE, URINE COMPREHENSIVE   Body mass index 29.0-29.9, adult      Outpatient Encounter Medications as of 12/29/2020  Medication Sig  . cephALEXin (KEFLEX) 500 MG capsule Take 1 capsule (500 mg total) by mouth 3 (three) times daily.  Marland Kitchen terconazole (TERAZOL 7) 0.4 % vaginal cream Place 1 applicator vaginally at bedtime for 5 days.  Marland Kitchen levonorgestrel (MIRENA, 52 MG,) 20 MCG/24HR IUD 1 Intra Uterine Device (1 each total) by Intrauterine route once for 1 dose.  . [DISCONTINUED] SUPREP BOWEL PREP KIT 17.5-3.13-1.6 GM/177ML SOLN USE AS DIRECTED (Patient not taking: Reported on 12/29/2020)   No facility-administered encounter medications on file as of 12/29/2020.     Return precautions given. Red Flags discussed. The patient was given clear instructions to go to ER or return to medical center if any red flags develop, symptoms do not improve, worsen or new problems develop. They verbalized understanding.  Risks, benefits, and alternatives of the medications and treatment plan prescribed today were discussed, and patient expressed understanding.    Education regarding symptom management and diagnosis given to patient on AVS.  Patient was in agreement with treatment plan.   Continue to follow with  Kelby Aline. Evian Derringer AGNP-C, FNP-C for routine health maintenance.   Kelby Aline. Suraiya Dickerson AGNP-C, FNP-C The patient is advised to begin progressive daily aerobic exercise program,  follow a low fat, low cholesterol diet, decrease or avoid alcohol intake, reduce salt in diet and cooking, reduce exposure to stress, improve dietary compliance, continue current medications, continue current healthy lifestyle patterns and return for routine annual checkups. Follow-up: Return in 3 months (on 03/31/2021), or if symptoms worsen or fail to improve, for at any time for any worsening symptoms, Go to Emergency room/ urgent care if worse.   Marcille Buffy, FNP

## 2020-12-29 NOTE — Patient Instructions (Addendum)
Health Maintenance, Female Adopting a healthy lifestyle and getting preventive care are important in promoting health and wellness. Ask your health care provider about:  The right schedule for you to have regular tests and exams.  Things you can do on your own to prevent diseases and keep yourself healthy. What should I know about diet, weight, and exercise? Eat a healthy diet  Eat a diet that includes plenty of vegetables, fruits, low-fat dairy products, and lean protein.  Do not eat a lot of foods that are high in solid fats, added sugars, or sodium.   Maintain a healthy weight Body mass index (BMI) is used to identify weight problems. It estimates body fat based on height and weight. Your health care provider can help determine your BMI and help you achieve or maintain a healthy weight. Get regular exercise Get regular exercise. This is one of the most important things you can do for your health. Most adults should:  Exercise for at least 150 minutes each week. The exercise should increase your heart rate and make you sweat (moderate-intensity exercise).  Do strengthening exercises at least twice a week. This is in addition to the moderate-intensity exercise.  Spend less time sitting. Even light physical activity can be beneficial. Watch cholesterol and blood lipids Have your blood tested for lipids and cholesterol at 48 years of age, then have this test every 5 years. Have your cholesterol levels checked more often if:  Your lipid or cholesterol levels are high.  You are older than 48 years of age.  You are at high risk for heart disease. What should I know about cancer screening? Depending on your health history and family history, you may need to have cancer screening at various ages. This may include screening for:  Breast cancer.  Cervical cancer.  Colorectal cancer.  Skin cancer.  Lung cancer. What should I know about heart disease, diabetes, and high blood  pressure? Blood pressure and heart disease  High blood pressure causes heart disease and increases the risk of stroke. This is more likely to develop in people who have high blood pressure readings, are of African descent, or are overweight.  Have your blood pressure checked: ? Every 3-5 years if you are 18-39 years of age. ? Every year if you are 40 years old or older. Diabetes Have regular diabetes screenings. This checks your fasting blood sugar level. Have the screening done:  Once every three years after age 40 if you are at a normal weight and have a low risk for diabetes.  More often and at a younger age if you are overweight or have a high risk for diabetes. What should I know about preventing infection? Hepatitis B If you have a higher risk for hepatitis B, you should be screened for this virus. Talk with your health care provider to find out if you are at risk for hepatitis B infection. Hepatitis C Testing is recommended for:  Everyone born from 1945 through 1965.  Anyone with known risk factors for hepatitis C. Sexually transmitted infections (STIs)  Get screened for STIs, including gonorrhea and chlamydia, if: ? You are sexually active and are younger than 48 years of age. ? You are older than 48 years of age and your health care provider tells you that you are at risk for this type of infection. ? Your sexual activity has changed since you were last screened, and you are at increased risk for chlamydia or gonorrhea. Ask your health care provider   if you are at risk.  Ask your health care provider about whether you are at high risk for HIV. Your health care provider may recommend a prescription medicine to help prevent HIV infection. If you choose to take medicine to prevent HIV, you should first get tested for HIV. You should then be tested every 3 months for as long as you are taking the medicine. Pregnancy  If you are about to stop having your period (premenopausal) and  you may become pregnant, seek counseling before you get pregnant.  Take 400 to 800 micrograms (mcg) of folic acid every day if you become pregnant.  Ask for birth control (contraception) if you want to prevent pregnancy. Osteoporosis and menopause Osteoporosis is a disease in which the bones lose minerals and strength with aging. This can result in bone fractures. If you are 27 years old or older, or if you are at risk for osteoporosis and fractures, ask your health care provider if you should:  Be screened for bone loss.  Take a calcium or vitamin D supplement to lower your risk of fractures.  Be given hormone replacement therapy (HRT) to treat symptoms of menopause. Follow these instructions at home: Lifestyle  Do not use any products that contain nicotine or tobacco, such as cigarettes, e-cigarettes, and chewing tobacco. If you need help quitting, ask your health care provider.  Do not use street drugs.  Do not share needles.  Ask your health care provider for help if you need support or information about quitting drugs. Alcohol use  Do not drink alcohol if: ? Your health care provider tells you not to drink. ? You are pregnant, may be pregnant, or are planning to become pregnant.  If you drink alcohol: ? Limit how much you use to 0-1 drink a day. ? Limit intake if you are breastfeeding.  Be aware of how much alcohol is in your drink. In the [place] one drink equals one 12 oz bottle of beer (355 mL), one 5 oz glass of wine (148 mL), or one 1 oz glass of hard liquor (44 mL). General instructions  Schedule regular health, dental, and eye exams.  Stay current with your vaccines.  Tell your health care provider if: ? You often feel depressed. ? You have ever been abused or do not feel safe at home. Summary  Adopting a healthy lifestyle and getting preventive care are important in promoting health and wellness.  Follow your health care provider's instructions about healthy  diet, exercising, and getting tested or screened for diseases.  Follow your health care provider's instructions on monitoring your cholesterol and blood pressure. This information is not intended to replace advice given to you by your health care provider. Make sure you discuss any questions you have with your health care provider. Document Revised: 07/24/2018 Document Reviewed: 07/24/2018 Elsevier Patient Education  2021 [employer] Inc[employer] Terconazole vaginal cream What is this medicine? TERCONAZOLE (ter KON a zole) is an antifungal medicine. It is used to treat yeast infections of the vagina. This medicine may be used for other purposes; ask your health care provider or pharmacist if you have questions. COMMON BRAND NAME(S): Terazol 3, Terazol 7, Zazole What should I tell my health care provider before I take this medicine? They need to know if you have any of these conditions:  an unusual or allergic reaction to terconazole, other antifungals, other medicines, foods, dyes or preservatives  pregnant or trying to get pregnant  breast-feeding How should I use this medicine?  This medicine is only for use in the vagina. Do not take by mouth. Wash hands before and after use. Read package directions carefully before using. Use this medicine at bedtime, unless otherwise directed by your doctor or health care professional. Screw the applicator onto the end of the tube and squeeze the tube to fill the applicator. Remove the applicator from the tube. Lie on your back. Gently insert the applicator tip high in the vagina and push the plunger to release the cream into the vagina. Gently remove the applicator. Wash the applicator well with warm water and soap. Use at regular intervals. Do not get this medicine in your eyes. If you do, rinse out with plenty of cool tap water. Finish the full course prescribed by your doctor or health care professional even if you think your condition is better. Do not stop using  this medicine if your menstrual period starts during the time of treatment. Talk to your pediatrician regarding the use of this medicine in children. Special care may be needed. Overdosage: If you think you have taken too much of this medicine contact a poison control center or emergency room at once. NOTE: This medicine is only for you. Do not share this medicine with others. What if I miss a dose? If you miss a dose, use it as soon as you can. If it is almost time for your next dose, use only that dose. Do not use double or extra doses. What may interact with this medicine? Interactions are not expected. Do not use any other vaginal products without telling your doctor or health care professional. This list may not describe all possible interactions. Give your health care provider a list of all the medicines, herbs, non-prescription drugs, or dietary supplements you use. Also tell them if you smoke, drink alcohol, or use illegal drugs. Some items may interact with your medicine. What should I watch for while using this medicine? Tell your doctor or health care professional if your symptoms do not start to get better within a few days. It is better not to have sex until you have finished your treatment. If you have sex, your partner should use a condom during sex to help prevent transfer of the infection. Your sexual partner may also need treatment. Vaginal medicines usually will come out of the vagina during treatment. To keep the medicine from getting on your clothing, wear a mini-pad or sanitary napkin. The use of tampons is not recommended since they may soak up the medicine. To help clear up the infection, wear freshly washed cotton, not synthetic, underwear. What side effects may I notice from receiving this medicine? Side effects that you should report to your doctor or health care professional as soon as possible:  painful or difficult urination  vaginal pain Side effects that usually do  not require medical attention (report to your doctor or health care professional if they continue or are bothersome):  headache  menstrual pain  stomach upset  vaginal irritation, itching or burning This list may not describe all possible side effects. Call your doctor for medical advice about side effects. You may report side effects to FDA at 1-800-FDA-1088. Where should I keep my medicine? Keep out of the reach of children. Store at room temperature between 15 and 30 degrees C (59 and 86 degrees F). Throw away any unused medicine after the expiration date. NOTE: This sheet is a summary. It may not cover all possible information. If you have questions about this  medicine, talk to your doctor, pharmacist, or health care provider.  2021 Elsevier/Gold Standard (2008-04-15 13:51:27) Cephalexin Tablets or Capsules What is this medicine? CEPHALEXIN (sef a LEX in) is a cephalosporin antibiotic. It treats some infections caused by bacteria. It will not work for colds, the flu, or other viruses. This medicine may be used for other purposes; ask your health care provider or pharmacist if you have questions. COMMON BRAND NAME(S): Biocef, Daxbia, Keflex, Keftab What should I tell my health care provider before I take this medicine? They need to know if you have any of these conditions:  bleeding disorder  kidney disease  liver disease  seizures  stomach or intestine problems like colitis  an unusual or allergic reaction to cephalexin, other penicillin or cephalosporin antibiotics, other medicines, foods, dyes, or preservatives  pregnant or trying to get pregnant  breast-feeding How should I use this medicine? Take this drug by mouth. Take it as directed on the prescription label at the same time every day. You can take it with or without food. If it upsets your stomach, take it with food. Take all of this drug unless your health care provider tells you to stop it early. Keep taking it  even if you think you are better. Talk to your health care provider about the use of this drug in children. While it may be prescribed for selected conditions, precautions do apply. Overdosage: If you think you have taken too much of this medicine contact a poison control center or emergency room at once. NOTE: This medicine is only for you. Do not share this medicine with others. What if I miss a dose? If you miss a dose, take it as soon as you can. If it is almost time for your next dose, take only that dose. Do not take double or extra doses. What may interact with this medicine?  probenecid  some other antibiotics This list may not describe all possible interactions. Give your health care provider a list of all the medicines, herbs, non-prescription drugs, or dietary supplements you use. Also tell them if you smoke, drink alcohol, or use illegal drugs. Some items may interact with your medicine. What should I watch for while using this medicine? Tell your health care provider if your symptoms do not start to get better or if they get worse. Do not treat diarrhea with over the counter products. Contact your health care provider if you have diarrhea that lasts more than 2 days or if it is severe and watery. This medicine may cause serious skin reactions. They can happen weeks to months after starting the medicine. Contact your health care provider right away if you notice fevers or flu-like symptoms with a rash. The rash may be red or purple and then turn into blisters or peeling of the skin. Or, you might notice a red rash with swelling of the face, lips or lymph nodes in your neck or under your arms. If you have diabetes, you may get a false-positive result for sugar in your urine. Check with your health care provider. What side effects may I notice from receiving this medicine? Side effects that you should report to your doctor or health care provider as soon as possible:  allergic reactions  (skin rash, itching or hives; swelling of the face, lips, or tongue)  bloody or watery diarrhea  fever  kidney injury (trouble passing urine or change in the amount of urine)  low red blood cell counts (trouble breathing; feeling faint;  lightheaded, falls; unusually weak or tired)  redness, blistering, peeling, or loosening of the skin, including inside the mouth  unusual bruising or bleeding Side effects that usually do not require medical attention (report to your doctor or health care provider if they continue or are bothersome):  headache  dizziness  nausea, vomiting  unusual vaginal discharge, itching, or odor  upset stomach This list may not describe all possible side effects. Call your doctor for medical advice about side effects. You may report side effects to FDA at 1-800-FDA-1088. Where should I keep my medicine? Keep out of the reach of children and pets. Store at room temperature between 20 and 25 degrees C (68 and 77 degrees F). Throw away any unused drug after the expiration date. NOTE: This sheet is a summary. It may not cover all possible information. If you have questions about this medicine, talk to your doctor, pharmacist, or health care provider.  2021 Elsevier/Gold Standard (2019-06-05 15:26:31)   Fat and Cholesterol Restricted Eating Plan Getting too much fat and cholesterol in your diet may cause health problems. Choosing the right foods helps keep your fat and cholesterol at normal levels. This can keep you from getting certain diseases. Your doctor may recommend an eating plan that includes:  Total fat: ______% or less of total calories a day.  Saturated fat: ______% or less of total calories a day.  Cholesterol: less than _________mg a day.  Fiber: ______g a day. What are tips for following this plan? Meal planning  At meals, divide your plate into four equal parts: ? Fill one-half of your plate with vegetables and green salads. ? Fill  one-fourth of your plate with whole grains. ? Fill one-fourth of your plate with low-fat (lean) protein foods.  Eat fish that is high in omega-3 fats at least two times a week. This includes mackerel, tuna, sardines, and salmon.  Eat foods that are high in fiber, such as whole grains, beans, apples, broccoli, carrots, peas, and barley. General tips  Work with your doctor to lose weight if you need to.  Avoid: ? Foods with added sugar. ? Fried foods. ? Foods with partially hydrogenated oils.  Limit alcohol intake to no more than 1 drink a day for nonpregnant women and 2 drinks a day for men. One drink equals 12 oz of beer, 5 oz of wine, or 1 oz of hard liquor.   Reading food labels  Check food labels for: ? Trans fats. ? Partially hydrogenated oils. ? Saturated fat (g) in each serving. ? Cholesterol (mg) in each serving. ? Fiber (g) in each serving.  Choose foods with healthy fats, such as: ? Monounsaturated fats. ? Polyunsaturated fats. ? Omega-3 fats.  Choose grain products that have whole grains. Look for the word "whole" as the first word in the ingredient list. Cooking  Cook foods using low-fat methods. These include baking, boiling, grilling, and broiling.  Eat more home-cooked foods. Eat at restaurants and buffets less often.  Avoid cooking using saturated fats, such as butter, cream, palm oil, palm kernel oil, and coconut oil. Recommended foods Fruits  All fresh, canned (in natural juice), or frozen fruits. Vegetables  Fresh or frozen vegetables (raw, steamed, roasted, or grilled). Green salads. Grains  Whole grains, such as whole wheat or whole grain breads, crackers, cereals, and pasta. Unsweetened oatmeal, bulgur, barley, quinoa, or brown rice. Corn or whole wheat flour tortillas. Meats and other protein foods  Ground beef (85% or leaner), grass-fed beef, or beef  trimmed of fat. Skinless chicken or [place]. Ground chicken or [place]. Pork trimmed of fat. All  fish and seafood. Egg whites. Dried beans, peas, or lentils. Unsalted nuts or seeds. Unsalted canned beans. Nut butters without added sugar or oil. Dairy  Low-fat or nonfat dairy products, such as skim or 1% milk, 2% or reduced-fat cheeses, low-fat and fat-free ricotta or cottage cheese, or plain low-fat and nonfat yogurt. Fats and oils  Tub margarine without trans fats. Light or reduced-fat mayonnaise and salad dressings. Avocado. Olive, canola, sesame, or safflower oils. The items listed above may not be a complete list of foods and beverages you can eat. Contact a dietitian for more information.   Foods to avoid Fruits  Canned fruit in heavy syrup. Fruit in cream or butter sauce. Fried fruit. Vegetables  Vegetables cooked in cheese, cream, or butter sauce. Fried vegetables. Grains  White bread. White pasta. White rice. Cornbread. Bagels, pastries, and croissants. Crackers and snack foods that contain trans fat and hydrogenated oils. Meats and other protein foods  Fatty cuts of meat. Ribs, chicken wings, bacon, sausage, bologna, salami, chitterlings, fatback, hot dogs, bratwurst, and packaged lunch meats. Liver and organ meats. Whole eggs and egg yolks. Chicken and [place] with skin. Fried meat. Dairy  Whole or 2% milk, cream, half-and-half, and cream cheese. Whole milk cheeses. Whole-fat or sweetened yogurt. Full-fat cheeses. Nondairy creamers and whipped toppings. Processed cheese, cheese spreads, and cheese curds. Beverages  Alcohol. Sugar-sweetened drinks such as sodas, lemonade, and fruit drinks. Fats and oils  Butter, stick margarine, lard, shortening, ghee, or bacon fat. Coconut, palm kernel, and palm oils. Sweets and desserts  Corn syrup, sugars, honey, and molasses. Candy. Jam and jelly. Syrup. Sweetened cereals. Cookies, pies, cakes, donuts, muffins, and ice cream. The items listed above may not be a complete list of foods and beverages you should avoid. Contact a  dietitian for more information. Summary  Choosing the right foods helps keep your fat and cholesterol at normal levels. This can keep you from getting certain diseases.  At meals, fill one-half of your plate with vegetables and green salads.  Eat high-fiber foods, like whole grains, beans, apples, carrots, peas, and barley.  Limit added sugar, saturated fats, alcohol, and fried foods. This information is not intended to replace advice given to you by your health care provider. Make sure you discuss any questions you have with your health care provider. Document Revised: 12/03/2019 Document Reviewed: 12/03/2019 Elsevier Patient Education  2021 [employer].

## 2020-12-31 LAB — CULTURE, URINE COMPREHENSIVE
MICRO NUMBER:: 11905740
SPECIMEN QUALITY:: ADEQUATE

## 2021-01-02 ENCOUNTER — Other Ambulatory Visit: Payer: Self-pay | Admitting: Adult Health

## 2021-01-02 DIAGNOSIS — N3000 Acute cystitis without hematuria: Secondary | ICD-10-CM

## 2021-01-02 NOTE — Progress Notes (Signed)
Gram positive bacteria Keflex should cover , advise return for urine recheck 1 week after antibiotic completion to be sure infection cleared in urine.   TSh for thyroid within normal.

## 2021-01-18 ENCOUNTER — Other Ambulatory Visit: Payer: Self-pay

## 2021-01-18 ENCOUNTER — Other Ambulatory Visit (INDEPENDENT_AMBULATORY_CARE_PROVIDER_SITE_OTHER): Payer: 59

## 2021-01-18 DIAGNOSIS — N3000 Acute cystitis without hematuria: Secondary | ICD-10-CM | POA: Diagnosis not present

## 2021-01-20 LAB — CULTURE, URINE COMPREHENSIVE
MICRO NUMBER:: 11978358
SPECIMEN QUALITY:: ADEQUATE

## 2021-01-20 NOTE — Progress Notes (Signed)
Please verify if patient still having any urinary symptoms at all.  Her urine culture returned for gram-positive cocci isolated and this could represent just colonization of normal bacteria.  If she is still having symptoms we will need to retreat her with antibiotics please let me know.

## 2021-01-21 ENCOUNTER — Encounter: Payer: Self-pay | Admitting: Adult Health

## 2021-01-22 ENCOUNTER — Other Ambulatory Visit: Payer: Self-pay | Admitting: Adult Health

## 2021-01-22 MED ORDER — AMOXICILLIN-POT CLAVULANATE 875-125 MG PO TABS: 1.0000 | ORAL_TABLET | Freq: Two times a day (BID) | ORAL | 0 refills | Status: DC

## 2021-01-22 NOTE — Progress Notes (Signed)
Meds ordered this encounter  ?Medications  ? amoxicillin-clavulanate (AUGMENTIN) 875-125 MG tablet  ?  Sig: Take 1 tablet by mouth 2 (two) times daily.  ?  Dispense:  14 tablet  ?  Refill:  0  ? ? ?

## 2021-01-24 ENCOUNTER — Telehealth: Payer: Self-pay

## 2021-01-24 DIAGNOSIS — N3000 Acute cystitis without hematuria: Secondary | ICD-10-CM

## 2021-01-24 NOTE — Telephone Encounter (Signed)
Anne Pap, FNP  01/20/2021  8:37 PM EDT      Please verify if patient still having any urinary symptoms at all.  Her urine culture returned for gram-positive cocci isolated and this could represent just colonization of normal bacteria.  If she is still having symptoms we will needto retreat her with antibiotics please let me know.

## 2021-01-24 NOTE — Telephone Encounter (Signed)
Labs ordered for urinalysis and urine culture for future labs.

## 2021-07-15 NOTE — Telephone Encounter (Signed)
Mirena rcvd/charged 06/23/2019 

## 2021-10-20 ENCOUNTER — Encounter: Payer: Self-pay | Admitting: Obstetrics and Gynecology

## 2021-10-20 ENCOUNTER — Other Ambulatory Visit (HOSPITAL_COMMUNITY)
Admission: RE | Admit: 2021-10-20 | Discharge: 2021-10-20 | Disposition: A | Discharge status: Home / Self Care | Payer: No Typology Code available for payment source | Source: Ambulatory Visit | Attending: Obstetrics and Gynecology | Admitting: Obstetrics and Gynecology

## 2021-10-20 ENCOUNTER — Other Ambulatory Visit: Payer: Self-pay

## 2021-10-20 ENCOUNTER — Ambulatory Visit (INDEPENDENT_AMBULATORY_CARE_PROVIDER_SITE_OTHER): Payer: No Typology Code available for payment source | Admitting: Obstetrics and Gynecology

## 2021-10-20 VITALS — BP 120/80 | Ht 68.0 in | Wt 194.0 lb

## 2021-10-20 DIAGNOSIS — R300 Dysuria: Secondary | ICD-10-CM

## 2021-10-20 DIAGNOSIS — Z124 Encounter for screening for malignant neoplasm of cervix: Secondary | ICD-10-CM

## 2021-10-20 DIAGNOSIS — Z01419 Encounter for gynecological examination (general) (routine) without abnormal findings: Secondary | ICD-10-CM

## 2021-10-20 DIAGNOSIS — Z1231 Encounter for screening mammogram for malignant neoplasm of breast: Secondary | ICD-10-CM

## 2021-10-20 MED ORDER — NITROFURANTOIN MONOHYD MACRO 100 MG PO CAPS: 100.0000 mg | ORAL_CAPSULE | Freq: Two times a day (BID) | ORAL | 0 refills | Status: AC

## 2021-10-20 NOTE — Patient Instructions (Signed)
[facility] [place] for Calcium and Vitamin D  Age (yr) Calcium Recommended Dietary Allowance (mg/day) Vitamin D Recommended Dietary Allowance (international units/day)  9-18 1,300 600  19-50 1,000 600  51-70 1,200 600  71 and older 1,200 800  Data from Institute of [employer]. Dietary reference intakes: calcium, vitamin D. [place], [place]: [employer]; 2011.   Exercising to Stay Healthy To become healthy and stay healthy, it is recommended that you do moderate-intensity and vigorous-intensity exercise. You can tell that you are exercising at a moderate intensity if your heart starts beating faster and you start breathing faster but can still hold a conversation. You can tell that you are exercising at a vigorous intensity if you are breathing much harder and faster and cannot hold a conversation while exercising. How can exercise benefit me? Exercising regularly is important. It has many health benefits, such as: Improving overall fitness, flexibility, and endurance. Increasing bone density. Helping with weight control. Decreasing body fat. Increasing muscle strength and endurance. Reducing stress and tension, anxiety, depression, or anger. Improving overall health. What guidelines should I follow while exercising? Before you start a new exercise program, talk with your health care provider. Do not exercise so much that you hurt yourself, feel dizzy, or get very short of breath. Wear comfortable clothes and wear shoes with good support. Drink plenty of water while you exercise to prevent dehydration or heat stroke. Work out until your breathing and your heartbeat get faster (moderate intensity). How often should I exercise? Choose an activity that you enjoy, and set realistic goals. Your health care provider can help you make an activity plan that is individually designed and works best for you. Exercise regularly as told by your health  care provider. This may include: Doing strength training two times a week, such as: Lifting weights. Using resistance bands. Push-ups. Sit-ups. Yoga. Doing a certain intensity of exercise for a given amount of time. Choose from these options: A total of 150 minutes of moderate-intensity exercise every week. A total of 75 minutes of vigorous-intensity exercise every week. A mix of moderate-intensity and vigorous-intensity exercise every week. Children, pregnant women, people who have not exercised regularly, people who are overweight, and older adults may need to talk with a health care provider about what activities are safe to perform. If you have a medical condition, be sure to talk with your health care provider before you start a new exercise program. What are some exercise ideas? Moderate-intensity exercise ideas include: Walking 1 mile (1.6 km) in about 15 minutes. Biking. Hiking. Golfing. Dancing. Water aerobics. Vigorous-intensity exercise ideas include: Walking 4.5 miles (7.2 km) or more in about 1 hour. Jogging or running 5 miles (8 km) in about 1 hour. Biking 10 miles (16.1 km) or more in about 1 hour. Lap swimming. Roller-skating or in-line skating. Cross-country skiing. Vigorous competitive sports, such as football, basketball, and soccer. Jumping rope. Aerobic dancing. What are some everyday activities that can help me get exercise? Yard work, such as: Psychologist, educational. Raking and bagging leaves. Washing your car. Pushing a stroller. Shoveling snow. Gardening. Washing windows or floors. How can I be more active in my day-to-day activities? Use stairs instead of an elevator. Take a walk during your lunch break. If you drive, park your car farther away from your work or school. If you take public transportation, get off one stop early and walk the rest of the way. Stand up or walk around during all of  your indoor phone calls. Get up, stretch, and walk  around every 30 minutes throughout the day. Enjoy exercise with a friend. Support to continue exercising will help you keep a regular routine of activity. Where to find more information You can find more information about exercising to stay healthy from: U.S. Department of Health and Human Services: [employer].at Centers for Disease Control and Prevention (CDC): http://www.wolf.info/ Summary Exercising regularly is important. It will improve your overall fitness, flexibility, and endurance. Regular exercise will also improve your overall health. It can help you control your weight, reduce stress, and improve your bone density. Do not exercise so much that you hurt yourself, feel dizzy, or get very short of breath. Before you start a new exercise program, talk with your health care provider. This information is not intended to replace advice given to you by your health care provider. Make sure you discuss any questions you have with your health care provider. Document Revised: 11/26/2020 Document Reviewed: 11/26/2020 Elsevier Patient Education  2022 [place] Eating There are many ways to save money at the grocery store and continue to eat healthy. You can be successful if you: Plan meals according to your budget. Make a grocery list and only purchase food according to your grocery list. Prepare food yourself at home. What are tips for following this plan? Reading food labels Compare food labels between brand name foods and the store brand. Often the nutritional value is the same, but the store brand is lower cost. Look for products that do not have added sugar, fat, or salt (sodium). These often cost the same but are healthier for you. Products may be labeled as: Sugar-free. Nonfat. Low-fat. Sodium-free. Low-sodium. Look for lean ground beef labeled as at least 92% lean and 8% fat. Shopping  Buy only the items on your grocery list and go only to the areas of the store  that have the items on your list. Use coupons only for foods and brands you normally buy. Avoid buying items you wouldn't normally buy simply because they are on sale. Check online and in newspapers for weekly deals. Buy healthy items from the bulk bins when available, such as herbs, spices, flour, pasta, nuts, and dried fruit. Buy fruits and vegetables that are in season. Prices are usually lower on in-season produce. Look at the unit price on the price tag. Use it to compare different brands and sizes to find out which item is the best deal. Choose healthy items that are often low-cost, such as carrots, potatoes, apples, bananas, and oranges. Dried or canned beans are a low-cost protein source. Buy in bulk and freeze extra food. Items you can buy in bulk include meats, fish, poultry, frozen fruits, and frozen vegetables. Avoid buying "ready-to-eat" foods, such as pre-cut fruits and vegetables and pre-made salads. If possible, shop around to discover where you can find the best prices. Consider other retailers such as dollar stores, larger [employer], local fruit and vegetable stands, and farmers markets. Do not shop when you are hungry. If you shop while hungry, it may be hard to stick to your list and budget. Resist impulse buying. Use your grocery list as your official plan for the week. Buy a variety of vegetables and fruits by purchasing fresh, frozen, and canned items. Look at the top and bottom shelves for deals. Foods at eye level (eye level of an adult or child) are usually more expensive. Be efficient with your time when shopping. The more time you  spend at the store, the more money you are likely to spend. To save money when choosing more expensive foods like meats and dairy: Choose cheaper cuts of meat, such as bone-in chicken thighs and drumsticks instead of skinless and boneless chicken. When you are ready to prepare the chicken, you can remove the skin yourself to make it  healthier. Choose lean meats like chicken or [place] instead of beef. Choose canned seafood, such as tuna, salmon, or sardines. Buy eggs as a low-cost source of protein. Buy dried beans and peas, such as lentils, split peas, or kidney beans instead of meats. Dried beans and peas are a good alternative source of protein. Buy the larger tubs of yogurt instead of individual-sized containers. Choose water instead of sodas and other sweetened beverages. Avoid buying chips, cookies, and other "junk food." These items are usually expensive and not healthy. Cooking Make extra food and freeze the extras in meal-sized containers or in individual portions for fast meals and snacks. Pre-cook on days when you have extra time to prepare meals in advance. You can keep these meals in the fridge or freezer and reheat for a quick meal. When you come home from the grocery store, wash, peel, and cut fruits and vegetables so they are ready to use and eat. This will help reduce food waste. Meal planning Do not eat out or get fast food. Prepare food at home. Make a grocery list and make sure to bring it with you to the store. If you have a smart phone, you could use your phone to create your shopping list. Plan meals and snacks according to a grocery list and budget you create. Use leftovers in your meal plan for the week. Look for recipes where you can cook once and make enough food for two meals. Prepare budget-friendly types of meals like stews, casseroles, and stir-fry dishes. Try some meatless meals or try "no cook" meals like salads. Make sure that half your plate is filled with fruits or vegetables. Choose from fresh, frozen, or canned fruits and vegetables. If eating canned, remember to rinse them before eating. This will remove any excess salt added for packaging. Summary Eating healthy on a budget is possible if you plan your meals according to your budget, purchase according to your budget and grocery list,  and prepare food yourself. Tips for buying more food on a limited budget include buying generic brands, using coupons only for foods you normally buy, and buying healthy items from the bulk bins when available. Tips for buying cheaper food to replace expensive food include choosing cheaper, lean cuts of meat, and buying dried beans and peas. This information is not intended to replace advice given to you by your health care provider. Make sure you discuss any questions you have with your health care provider. Document Revised: 05/13/2020 Document Reviewed: 05/13/2020 Elsevier Patient Education  [place] protect organs, store calcium, anchor muscles, and support the whole body. Keeping your bones strong is important, especially as you get older. You can take actions to help keep your bones strong and healthy. Why is keeping my bones healthy important? Keeping your bones healthy is important because your body constantly replaces bone cells. Cells get old, and new cells take their place. As we age, we lose bone cells because the body may not be able to make enough new cells to replace the old cells. The amount of bone cells and bone tissue you have is referred to as  bone mass. The higher your bone mass, the stronger your bones. The aging process leads to an overall loss of bone mass in the body, which can increase the likelihood of: Broken bones. A condition in which the bones become weak and brittle (osteoporosis). A large decline in bone mass occurs in older adults. In women, it occurs about the time of menopause. What actions can I take to keep my bones healthy? Good health habits are important for maintaining healthy bones. This includes eating nutritious foods and exercising regularly. To have healthy bones, you need to get enough of the right minerals and vitamins. Most nutrition experts recommend getting these nutrients from the foods that you eat. In some cases,  taking supplements may also be recommended. Doing certain types of exercise is also important for bone health. What are the nutritional recommendations for healthy bones? Eating a well-balanced diet with plenty of calcium and vitamin D will help to protect your bones. Nutritional recommendations vary from person to person. Ask your health care provider what is healthy for you. Here are some general guidelines. Get enough calcium Calcium is the most important (essential) mineral for bone health. Most people can get enough calcium from their diet, but supplements may be recommended for people who are at risk for osteoporosis. Good sources of calcium include: Dairy products, such as low-fat or nonfat milk, cheese, and yogurt. Dark green leafy vegetables, such as bok choy and broccoli. Foods that have calcium added to them (are fortified). Foods that may be fortified with calcium include orange juice, cereal, bread, soy beverages, and tofu products. Nuts, such as almonds. Follow these recommended amounts for daily calcium intake: Infants, 0-6 months: 200 mg. Infants, 6-12 months: 260 mg. Children, age 60-3: 700 mg. Children, age 53-8: 1,000 mg. Children, age 75-13: 1,300 mg. Teens, age 54-18: 1,300 mg. Adults, age 74-50: 1,000 mg. Adults, age 602-70: Men: 1,000 mg. Women: 1,200 mg. Adults, age 27 or older: 1,200 mg. Pregnant and breastfeeding females: Teens: 1,300 mg. Adults: 1,000 mg. Get enough vitamin D Vitamin D is the most essential vitamin for bone health. It helps the body absorb calcium. Sunlight stimulates the skin to make vitamin D, so be sure to get enough sunlight. If you live in a cold climate or you do not get outside often, your health care provider may recommend that you take vitamin D supplements. Good sources of vitamin D in your diet include: Egg yolks. Saltwater fish. Milk and cereal fortified with vitamin D. Follow these recommended amounts for daily vitamin D  intake: Infants, 0-12 months: 400 international units (IU). Children and teens, age 60-18: 6 international units. Adults, age 53 or younger: 18 international units. Adults, age 54 or older: 79-1,000 international units. Get other important nutrients Other nutrients that are important for bone health include: Phosphorus. This mineral is found in meat, poultry, dairy foods, nuts, and legumes. The recommended daily intake for adult men and adult women is 700 mg. Magnesium. This mineral is found in seeds, nuts, dark green vegetables, and legumes. The recommended daily intake for adult men is 400-420 mg. For adult women, it is 310-320 mg. Vitamin K. This vitamin is found in green leafy vegetables. The recommended daily intake is 120 mcg for adult men and 90 mcg for adult women. What type of physical activity is best for building and maintaining healthy bones? Weight-bearing and strength-building activities are important for building and maintaining healthy bones. Weight-bearing activities cause muscles and bones to work against gravity. Strength-building activities  increase the strength of the muscles that support bones. Weight-bearing and muscle-building activities include: Walking and hiking. Jogging and running. Dancing. Gym exercises. Lifting weights. Tennis and racquetball. Climbing stairs. Aerobics. Adults should get at least 30 minutes of moderate physical activity on most days. Children should get at least 60 minutes of moderate physical activity on most days. Ask your health care provider what type of exercise is best for you. How can I find out if my bone mass is low? Bone mass can be measured with an X-ray test called a bone mineral density (BMD) test. This test is recommended for all women who are age 19 or older. It may also be recommended for: Men who are age 83 or older. People who are at risk for osteoporosis because of: Having a long-term disease that weakens bones, such as  kidney disease or rheumatoid arthritis. Having menopause earlier than normal. Taking medicine that weakens bones, such as steroids, thyroid hormones, or hormone treatment for breast cancer or prostate cancer. Smoking. Drinking three or more alcoholic drinks a day. Being underweight. Sedentary lifestyle. If you find that you have a low bone mass, you may be able to prevent osteoporosis or further bone loss by changing your diet and lifestyle. Where can I find more information? Bone Health & Osteoporosis Foundation: https://carlson-fletcher.info/ [employer]: www.bones.http://www.myers.net/ International Osteoporosis Foundation: Investment banker, operational.iofbonehealth.org Summary The aging process leads to an overall loss of bone mass in the body, which can increase the likelihood of broken bones and osteoporosis. Eating a well-balanced diet with plenty of calcium and vitamin D will help to protect your bones. Weight-bearing and strength-building activities are also important for building and maintaining strong bones. Bone mass can be measured with an X-ray test called a bone mineral density (BMD) test. This information is not intended to replace advice given to you by your health care provider. Make sure you discuss any questions you have with your health care provider. Document Revised: 01/12/2021 Document Reviewed: 01/12/2021 Elsevier Patient Education  2022 Else[employer] Inc. Colorectal Cancer Screening Colorectal cancer screening is a group of tests that are used to check for colorectal cancer before symptoms develop. Colorectal refers to the colon and rectum. The colon and rectum are located at the end of the digestive tract and carry stool (feces) out of the body. Who should have screening? All adults who are 42-28 years old should have screening. Your health care provider may recommend screening before age 13. You will have tests every 1-10 years, depending on your results and the type of screening test. Screening  recommendations for adults who are 42-26 years old vary depending on a person's health. People older than age 80 should no longer get colorectal cancer screening. You may have screening tests starting before age 19, or more often than other people, if you have any of these risk factors: A personal or family history of colorectal cancer or abnormal growths known as polyps in your colon. Inflammatory bowel disease, such as ulcerative colitis or Crohn's disease. A history of having radiation treatment to the abdomen or the area between the hip bones (pelvic area) for cancer. A type of genetic syndrome that is passed from parent to child (hereditary), such as: Lynch syndrome. Familial adenomatous polyposis. Turcot syndrome. Peutz-Jeghers syndrome. MUTYH-associated polyposis (MAP). A personal history of diabetes. Types of tests There are several types of colorectal screening tests. You may have one or more of the following: Guaiac-based fecal occult blood testing. For this test, a stool sample is  checked for hidden (occult) blood, which could be a sign of colorectal cancer. Fecal immunochemical test (FIT). For this test, a stool sample is checked for blood, which could be a sign of colorectal cancer. Stool DNA test. For this test, a stool sample is checked for blood and changes in DNA that could lead to colorectal cancer. Sigmoidoscopy. During this test, a thin, flexible tube with a camera on the end, called a sigmoidoscope, is used to examine the rectum and the lower colon. Colonoscopy. During this test, a long, flexible tube with a camera on the end, called a colonoscope, is used to examine the entire colon and rectum. Also, sometimes a tissue sample is taken to be looked at under a microscope (biopsy) or small polyps are removed during this test. Virtual colonoscopy. Instead of a colonoscope, this type of colonoscopy uses a CT scan to take pictures of the colon and rectum. A CT scan is a type of  X-ray that is made using computers. What are the benefits of screening? Screening reduces your risk for colorectal cancer and can help identify cancer at an early stage, when the cancer can be removed or treated more easily. It is common for polyps to form in the lining of the colon, especially as you age. These polyps may be cancerous or become cancerous over time. Screening can identify these polyps. What are the risks of screening? Generally, these are safe tests. However, problems may occur, including: The need for more tests to confirm results from a stool sample test. Stool sample tests have fewer risks than other types of screening tests. Being exposed to low levels of radiation, if you had a test involving X-rays. This may slightly increase your cancer risk. The benefit of detecting cancer outweighs the slight increase in risk. Bleeding, damage to the intestine, or infection caused by a sigmoidoscopy or colonoscopy. A reaction to medicines given during a sigmoidoscopy or colonoscopy. Talk with your health care provider to understand your risk for colorectal cancer and to make a screening plan that is right for you. Questions to ask your health care provider When should I start colorectal cancer screening? What is my risk for colorectal cancer? How often do I need screening? Which screening tests do I need? How do I get my test results? What do my results mean? Where to find more information Learn more about colorectal cancer screening from: The American Cancer Society: cancer.org National Cancer Institute: cancer.gov Summary Colorectal cancer screening is a group of tests used to check for colorectal cancer before symptoms develop. All adults who are 7-10 years old should have screening. Your health care provider may recommend screening before age 64. You may have screening tests starting before age 40, or more often than other people, if you have certain risk factors. Screening  reduces your risk for colorectal cancer and can help identify cancer at an early stage, when the cancer can be removed or treated more easily. Talk with your health care provider to understand your risk for colorectal cancer and to make a screening plan that is right for you. This information is not intended to replace advice given to you by your health care provider. Make sure you discuss any questions you have with your health care provider. Document Revised: 11/19/2019 Document Reviewed: 11/19/2019 Elsevier Patient Education  2022 [employer].

## 2021-10-20 NOTE — Progress Notes (Signed)
? ? ?Gynecology Annual Exam  ?PCP: Flinchum, Eula Fried, FNP ? ?Chief Complaint:  ?Chief Complaint  ?Patient presents with  ? Annual Exam  ? ? ?History of Present Illness: Patient is a 51 y.o. G3P1011 presents for annual exam. The patient has no complaints today.  ? ?LMP: Patient's last menstrual period was 08/28/2021. ?Average Interval: regular, 28 days ?Duration of flow: 5 days ? ?Heavy Menses: no ?Dysmenorrhea: no ? ?She denies passage of large clots ?She denies sensations of gushing or flooding of blood. ?She denies accidents where she bleeds through her clothing. ?She denies that she changes a saturated pad or tampon more frequently than every hour.  ?She denies that pain from her periods limits her activities. ? ?The patient does perform self breast exams.  There is no notable family history of breast or ovarian cancer in her family. ? ?The patient denies regular exercise:  ? ?The patient denies current symptoms of depression.  ? ?PHQ-9: 0 ?GAD-7: 0  ? ?Review of Systems: ROS ? ?Past Medical History:  ?Past Medical History:  ?Diagnosis Date  ? Chronic cystitis   ? ? ?Past Surgical History:  ?History reviewed. No pertinent surgical history. ? ?Gynecologic History:  ?Patient's last menstrual period was 08/28/2021. ?Menarche: 16 ? ?History of fibroids, polyps, or ovarian cysts? : no  ?History of PCOS? no ?Hstory of Endometriosis? no ?History of abnormal pap smears? no ?Have you had any sexually transmitted infections in the past?  no ? ?Last Pap: Results were: 2022 NIL  ? ?She identifies as a female. She is sexually active with men.  ? She denies dyspareunia. She denies postcoital bleeding.  ? ? ?Obstetric History: G3P1011 ? ?Family History:  ?Family History  ?Problem Relation Age of Onset  ? Diabetes Mother   ? Hypertension Mother   ? Cancer Father 75  ?     lung  ? Diabetes Sister   ? Breast cancer Maternal Grandmother   ? Bladder Cancer Neg Hx   ? Kidney cancer Neg Hx   ? ? ?Social History:  ?Social History   ? ?Socioeconomic History  ? Marital status: Married  ?  Spouse name: Not on file  ? Number of children: Not on file  ? Years of education: Not on file  ? Highest education level: Not on file  ?Occupational History  ? Not on file  ?Tobacco Use  ? Smoking status: Never  ? Smokeless tobacco: Never  ?Vaping Use  ? Vaping Use: Never used  ?Substance and Sexual Activity  ? Alcohol use: No  ? Drug use: No  ? Sexual activity: Yes  ?  Birth control/protection: I.U.D.  ?  Comment: Mirena  ?Other Topics Concern  ? Not on file  ?Social History Narrative  ? Born and raised in [place], moved to [place] recently in 2018 to be with husband  ? ?Social Determinants of Health  ? ?Financial Resource Strain: Not on file  ?Food Insecurity: Not on file  ?Transportation Needs: Not on file  ?Physical Activity: Not on file  ?Stress: Not on file  ?Social Connections: Not on file  ?Intimate Partner Violence: Not on file  ? ? ?Allergies:  ?No Known Allergies ? ?Medications: ?Prior to Admission medications   ?Medication Sig Start Date End Date Taking? Authorizing Provider  ?amoxicillin-clavulanate (AUGMENTIN) 875-125 MG tablet Take 1 tablet by mouth 2 (two) times daily. ?Patient not taking: Reported on 10/20/2021 01/22/21   Flinchum, Eula Fried, FNP  ?levonorgestrel (MIRENA, 52 MG,) 20 MCG/24HR IUD 1  Intra Uterine Device (1 each total) by Intrauterine route once for 1 dose. 06/23/19 06/23/19  Copland, Ilona Sorrel, PA-C  ? ? ?Physical Exam ?Vitals: Blood pressure 120/80, height 5\' 8"  (1.727 m), weight 194 lb (88 kg), last menstrual period 08/28/2021. ? ?Physical Exam ?Constitutional:   ?   Appearance: She is well-developed.  ?Genitourinary:  ?   Genitourinary Comments: External: Normal appearing vulva. No lesions noted.  ?Speculum examination: Normal appearing cervix. No blood in the vaginal vault. No discharge.  IUD strings seen ?Bimanual examination: Uterus midline, non-tender, normal in size, shape and contour.  No CMT. No adnexal masses. No adnexal  tenderness. Pelvis not fixed. ? ?Breast Exam: breast equal without skin changes, nipple discharge, breast lump or enlarged lymph nodes ?  ?HENT:  ?   Head: Normocephalic and atraumatic.  ?Neck:  ?   Thyroid: No thyromegaly.  ?Cardiovascular:  ?   Rate and Rhythm: Normal rate and regular rhythm.  ?   Heart sounds: Normal heart sounds.  ?Pulmonary:  ?   Effort: Pulmonary effort is normal.  ?   Breath sounds: Normal breath sounds.  ?Abdominal:  ?   General: Bowel sounds are normal. There is no distension.  ?   Palpations: Abdomen is soft. There is no mass.  ?Musculoskeletal:  ?   Cervical back: Neck supple.  ?Neurological:  ?   Mental Status: She is alert and oriented to person, place, and time.  ?Skin: ?   General: Skin is warm and dry.  ?Psychiatric:     ?   Behavior: Behavior normal.     ?   Thought Content: Thought content normal.     ?   Judgment: Judgment normal.  ?Vitals reviewed.  ? ? ? ?Female chaperone present for pelvic and breast  portions of the physical exam ? ?Assessment: 51 y.o. G3P1011 routine annual exam ? ?Plan: ?Problem List Items Addressed This Visit   ? ?  ? Other  ? Culture Negative Dysuria  ? Relevant Orders  ? Urine Culture  ? ?Other Visit Diagnoses   ? ? Encounter for annual routine gynecological examination    -  Primary  ? Cervical cancer screening      ? Relevant Orders  ? Cytology - PAP  ? Breast cancer screening by mammogram      ? Relevant Orders  ? MM 3D SCREEN BREAST BILATERAL  ? ?  ? ? ?1) Mammogram - recommend yearly screening mammogram.  Mammogram was ordered today ? ?2) STI screening was offered and declined ? ?3) Desires yearly pap smears, collected today. ? ?4) Contraception - IUD, placed in 2020, continue ? ?5) Colonoscopy -- discussed, she will consider. Feels like insurance would not cover until she was 52.  ? ?6) Routine healthcare maintenance including cholesterol, diabetes screening discussed managed by PCP ? ?2021 MD, FACOG ?[facility] ?10/20/2021 ?1:56 PM ? ? ? ? ? ? ? ?

## 2021-10-22 LAB — URINE CULTURE: Organism ID, Bacteria: NO GROWTH

## 2021-10-25 LAB — CYTOLOGY - PAP
Comment: NEGATIVE
Diagnosis: NEGATIVE
High risk HPV: NEGATIVE

## 2022-01-19 ENCOUNTER — Other Ambulatory Visit: Payer: Self-pay

## 2022-01-19 ENCOUNTER — Other Ambulatory Visit: Payer: Self-pay | Admitting: Adult Health

## 2022-01-19 DIAGNOSIS — Z1231 Encounter for screening mammogram for malignant neoplasm of breast: Secondary | ICD-10-CM

## 2022-01-19 DIAGNOSIS — Z1211 Encounter for screening for malignant neoplasm of colon: Secondary | ICD-10-CM

## 2022-01-24 ENCOUNTER — Ambulatory Visit: Payer: 59

## 2022-01-31 ENCOUNTER — Encounter: Payer: Self-pay | Admitting: Family Medicine

## 2022-01-31 ENCOUNTER — Ambulatory Visit (INDEPENDENT_AMBULATORY_CARE_PROVIDER_SITE_OTHER): Payer: No Typology Code available for payment source | Admitting: Family Medicine

## 2022-01-31 VITALS — BP 104/60 | HR 79 | Ht 69.0 in | Wt 196.6 lb

## 2022-01-31 DIAGNOSIS — E7800 Pure hypercholesterolemia, unspecified: Secondary | ICD-10-CM | POA: Diagnosis not present

## 2022-01-31 DIAGNOSIS — Z1159 Encounter for screening for other viral diseases: Secondary | ICD-10-CM

## 2022-01-31 DIAGNOSIS — D509 Iron deficiency anemia, unspecified: Secondary | ICD-10-CM | POA: Diagnosis not present

## 2022-01-31 DIAGNOSIS — R7309 Other abnormal glucose: Secondary | ICD-10-CM | POA: Diagnosis not present

## 2022-01-31 DIAGNOSIS — R300 Dysuria: Secondary | ICD-10-CM

## 2022-01-31 DIAGNOSIS — Z7689 Persons encountering health services in other specified circumstances: Secondary | ICD-10-CM

## 2022-01-31 DIAGNOSIS — Z114 Encounter for screening for human immunodeficiency virus [HIV]: Secondary | ICD-10-CM

## 2022-01-31 DIAGNOSIS — R350 Frequency of micturition: Secondary | ICD-10-CM

## 2022-01-31 NOTE — Patient Instructions (Addendum)
Thank you for coming to the office today.  Labs today.  Check on the coverage on Cologuard.  D-Mannose is a natural supplement that can actually help bind to urinary bacteria and reduce their effectiveness it can help prevent UTI from forming, and may reduce some symptoms. It likely cannot cure an active UTI but it is worth a try and good to prevent them with. Try $RemoveB'500mg'DFRkogCQ$  twice a day at a full dose if you want, or check package instructions for more info   Colon Cancer Screening: - For all adults age 12+ routine colon cancer screening is highly recommended.     - Recent guidelines from [place] recommend starting age of 4 - Early detection of colon cancer is important, because often there are no warning signs or symptoms, also if found early usually it can be cured. Late stage is hard to treat.  - If you are not interested in Colonoscopy screening (if done and normal you could be cleared for 5 to 10 years until next due), then Cologuard is an excellent alternative for screening test for Colon Cancer. It is highly sensitive for detecting DNA of colon cancer from even the earliest stages. Also, there is NO bowel prep required. - If Cologuard is NEGATIVE, then it is good for 3 years before next due - If Cologuard is POSITIVE, then it is strongly advised to get a Colonoscopy, which allows the GI doctor to locate the source of the cancer or polyp (even very early stage) and treat it by removing it. ------------------------- If you would like to proceed with Cologuard (stool DNA test) - FIRST, call your insurance company and tell them you want to check cost of Cologuard tell them CPT Code [phone] (it may be completely covered and you could get for no cost, OR max cost without any coverage is about $600). Also, keep in mind if you do NOT open the kit, and decide not to do the test, you will NOT be charged, you should contact the company if you decide not to do the test. - If you want to  proceed, you can notify us (phone message, Emily, or at next visit) and we will order it for you. The test kit will be delivered to you house within about 1 week. Follow instructions to collect sample, you may call the company for any help or questions, 24/7 telephone support at [phone].   Please schedule a Follow-up Appointment to: Return in about 1 year (around 02/01/2023) for 1 year fasting lab only then 1 week later Annual Physical.  If you have any other questions or concerns, please feel free to call the office or send a message through [place]. You may also schedule an earlier appointment if necessary.  Additionally, you may be receiving a survey about your experience at our office within a few days to 1 week by e-mail or mail. We value your feedback.  Nobie Putnam, DO [place]

## 2022-01-31 NOTE — Progress Notes (Signed)
Subjective:    Patient ID: Anne Weeks, female    DOB: 1971/07/17, 51 y.o.   MRN: [id number]  Anne Weeks is a 51 y.o. female presenting on 01/31/2022 for Establish Care   HPI  Re-Establish care  HYPERLIPIDEMIA: - Reports concerns. Last lipid panel 2022 elevated - Currently taking Omega 3, tolerating well without side effects or myalgias  Elevated A1c Last A1c 5.7 in 2022  Frequent UTI Not drinking enough water. She admits difficulty cannot take time to go void properly and will hold it and has had issue with recurrent UTI.  Cyst Neck Bump on back of neck, a few months ago, she can still feel it, non tender.  Health Maintenance: Due for Colon CA SCreen, she will check insurance w/ cologuard  Due for HIV Hep C Screen     12/29/2020    9:02 AM 09/17/2020    8:30 AM 12/18/2016    6:05 PM  Depression screen PHQ 2/9  Decreased Interest 0 0 0  Down, Depressed, Hopeless 0 0 0  PHQ - 2 Score 0 0 0  Altered sleeping 0 0   Tired, decreased energy 0 0   Change in appetite 0 0   Feeling bad or failure about yourself  0 0   Trouble concentrating 0 0   Moving slowly or fidgety/restless 0 0   Suicidal thoughts 0 0   PHQ-9 Score 0 0   Difficult doing work/chores  Not difficult at all     Past Medical History:  Diagnosis Date   Chronic cystitis    History reviewed. No pertinent surgical history. Social History   Socioeconomic History   Marital status: Married    Spouse name: Not on file   Number of children: Not on file   Years of education: Not on file   Highest education level: Not on file  Occupational History   Not on file  Tobacco Use   Smoking status: Never   Smokeless tobacco: Never  Vaping Use   Vaping Use: Never used  Substance and Sexual Activity   Alcohol use: Yes    Alcohol/week: 1.0 standard drink of alcohol    Types: 1 Standard drinks or equivalent per week    Comment: rarely   Drug use: No   Sexual activity: Yes    Birth control/protection:  I.U.D.    Comment: Mirena  Other Topics Concern   Not on file  Social History Narrative   Born and raised in [place], moved to [place] recently in 2018 to be with husband   Social Determinants of Corporate investment banker Strain: Not on file  Food Insecurity: Not on file  Transportation Needs: Not on file  Physical Activity: Not on file  Stress: Not on file  Social Connections: Not on file  Intimate Partner Violence: Not on file   Family History  Problem Relation Age of Onset   Diabetes Mother    Hypertension Mother    Cancer Father 35       lung   Diabetes Sister    Breast cancer Maternal Grandmother    Bladder Cancer Neg Hx    Kidney cancer Neg Hx    Current Outpatient Medications on File Prior to Visit  Medication Sig   levonorgestrel (MIRENA) 20 MCG/DAY IUD 1 each by Intrauterine route once.   Multiple Vitamin (MULTIVITAMIN) tablet Take 1 tablet by mouth daily.   Omega-3 Fatty Acids (FISH OIL) 1000 MG CAPS Take 1,000 mg by mouth daily.  No current facility-administered medications on file prior to visit.    Review of Systems Per HPI unless specifically indicated above     Objective:    BP 104/60   Pulse 79   Ht 5\' 9"  (1.753 m)   Wt 196 lb 9.6 oz (89.2 kg)   SpO2 99%   BMI 29.03 kg/m   Wt Readings from Last 3 Encounters:  01/31/22 196 lb 9.6 oz (89.2 kg)  10/20/21 194 lb (88 kg)  12/29/20 183 lb 12.8 oz (83.4 kg)    Physical Exam Vitals and nursing note reviewed.  Constitutional:      General: She is not in acute distress.    Appearance: Normal appearance. She is well-developed. She is not diaphoretic.     Comments: Well-appearing, comfortable, cooperative  HENT:     Head: Normocephalic and atraumatic.  Eyes:     General:        Right eye: No discharge.        Left eye: No discharge.     Conjunctiva/sclera: Conjunctivae normal.  Cardiovascular:     Rate and Rhythm: Normal rate.  Pulmonary:     Effort: Pulmonary effort is normal.  Skin:     General: Skin is warm and dry.     Findings: No erythema or rash.  Neurological:     Mental Status: She is alert and oriented to person, place, and time.  Psychiatric:        Mood and Affect: Mood normal.        Behavior: Behavior normal.        Thought Content: Thought content normal.     Comments: Well groomed, good eye contact, normal speech and thoughts    Results for orders placed or performed in visit on 10/20/21  Urine Culture   Specimen: Urine   UR  Result Value Ref Range   Urine Culture, Routine Final report    Organism ID, Bacteria No growth   Cytology - PAP  Result Value Ref Range   High risk HPV Negative    Adequacy      Satisfactory for evaluation; transformation zone component PRESENT.   Diagnosis      - Negative for intraepithelial lesion or malignancy (NILM)   Comment Normal Reference Range HPV - Negative       Assessment & Plan:   Problem List Items Addressed This Visit     Culture Negative Dysuria   Relevant Orders   Urinalysis, Routine w reflex microscopic   Urine Culture   Anemia   Relevant Orders   CBC with Differential/Platelet   Other Visit Diagnoses     Pure hypercholesterolemia    -  Primary   Relevant Orders   COMPLETE METABOLIC PANEL WITH GFR   Lipid panel   Encounter to establish care with new doctor       Elevated hemoglobin A1c       Relevant Orders   Hemoglobin A1c   Screening for HIV (human immunodeficiency virus)       Relevant Orders   HIV Antibody (routine testing w rflx)   Need for hepatitis C screening test       Relevant Orders   Hepatitis C antibody   Urinary frequency       Relevant Orders   Urinalysis, Routine w reflex microscopic   Urine Culture     Establish care  Labs today non fasting as requested  GYN recently Spring 2023 for pap and other testing.  Recurrent UTI Urinary  Frequency Check UA and Urine Culture Discussed prevention with D-Mannose option. Future if indicated would consider referral to  Urologist, if history of recurrent cystitis.    Orders Placed This Encounter  Procedures   Urine Culture   COMPLETE METABOLIC PANEL WITH GFR   CBC with Differential/Platelet   Lipid panel    Order Specific Question:   Has the patient fasted?    Answer:   Yes   Hemoglobin A1c   HIV Antibody (routine testing w rflx)   Hepatitis C antibody   Urinalysis, Routine w reflex microscopic     No orders of the defined types were placed in this encounter.     Follow up plan: Return in about 1 year (around 02/01/2023) for 1 year fasting lab only then 1 week later Annual Physical.  Saralyn Pilar, DO [facility] 01/31/2022, 10:19 AM

## 2022-02-01 LAB — LIPID PANEL
Cholesterol: 186 mg/dL (ref <200)
HDL: 60 mg/dL (ref >=50)
LDL Cholesterol (Calc): 107 mg/dL (calc) — ABNORMAL HIGH
Non-HDL Cholesterol (Calc): 126 mg/dL (calc) (ref <130)
Total CHOL/HDL Ratio: 3.1 (calc) (ref <5.0)
Triglycerides: 99 mg/dL (ref <150)

## 2022-02-01 LAB — COMPLETE METABOLIC PANEL WITH GFR
AG Ratio: 1.4 (calc) (ref 1.0–2.5)
ALT: 12 U/L (ref 6–29)
AST: 14 U/L (ref 10–35)
Albumin: 4.2 g/dL (ref 3.6–5.1)
Alkaline phosphatase (APISO): 79 U/L (ref 31–125)
BUN: 13 mg/dL (ref 7–25)
CO2: 27 mmol/L (ref 20–32)
Calcium: 9.6 mg/dL (ref 8.6–10.2)
Chloride: 102 mmol/L (ref 98–110)
Creat: 0.68 mg/dL (ref 0.50–0.99)
Globulin: 2.9 g/dL (calc) (ref 1.9–3.7)
Glucose, Bld: 113 mg/dL — ABNORMAL HIGH (ref 65–99)
Potassium: 4 mmol/L (ref 3.5–5.3)
Sodium: 136 mmol/L (ref 135–146)
Total Bilirubin: 0.4 mg/dL (ref 0.2–1.2)
Total Protein: 7.1 g/dL (ref 6.1–8.1)
eGFR: 107 mL/min/{1.73_m2} (ref >=60)

## 2022-02-01 LAB — CBC WITH DIFFERENTIAL/PLATELET
Absolute Monocytes: 521 cells/uL (ref 200–950)
Basophils Absolute: 24 cells/uL (ref 0–200)
Basophils Relative: 0.3 %
Eosinophils Absolute: 40 cells/uL (ref 15–500)
Eosinophils Relative: 0.5 %
HCT: 41.4 % (ref 35.0–45.0)
Hemoglobin: 13.2 g/dL (ref 11.7–15.5)
Lymphs Abs: 1667 cells/uL (ref 850–3900)
MCH: 26.3 pg — ABNORMAL LOW (ref 27.0–33.0)
MCHC: 31.9 g/dL — ABNORMAL LOW (ref 32.0–36.0)
MCV: 82.5 fL (ref 80.0–100.0)
MPV: 10.2 fL (ref 7.5–12.5)
Monocytes Relative: 6.6 %
Neutro Abs: 5649 cells/uL (ref 1500–7800)
Neutrophils Relative %: 71.5 %
Platelets: 333 10*3/uL (ref 140–400)
RBC: 5.02 10*6/uL (ref 3.80–5.10)
RDW: 13.9 % (ref 11.0–15.0)
Total Lymphocyte: 21.1 %
WBC: 7.9 10*3/uL (ref 3.8–10.8)

## 2022-02-01 LAB — URINALYSIS, ROUTINE W REFLEX MICROSCOPIC
Bilirubin Urine: NEGATIVE
Glucose, UA: NEGATIVE
Hgb urine dipstick: NEGATIVE
Ketones, ur: NEGATIVE
Leukocytes,Ua: NEGATIVE
Nitrite: NEGATIVE
Protein, ur: NEGATIVE
Specific Gravity, Urine: 1.009 (ref 1.001–1.035)
pH: 6 (ref 5.0–8.0)

## 2022-02-01 LAB — HEMOGLOBIN A1C
Hgb A1c MFr Bld: 5.7 % of total Hgb — ABNORMAL HIGH (ref <5.7)
Mean Plasma Glucose: 117 mg/dL
eAG (mmol/L): 6.5 mmol/L

## 2022-02-01 LAB — URINE CULTURE
MICRO NUMBER:: 13548811
SPECIMEN QUALITY:: ADEQUATE

## 2022-02-01 LAB — HEPATITIS C ANTIBODY: Hepatitis C Ab: NONREACTIVE

## 2022-02-01 LAB — HIV ANTIBODY (ROUTINE TESTING W REFLEX): HIV 1&2 Ab, 4th Generation: NONREACTIVE

## 2022-02-02 ENCOUNTER — Encounter: Payer: Self-pay | Admitting: Family Medicine

## 2022-02-02 DIAGNOSIS — R7309 Other abnormal glucose: Secondary | ICD-10-CM | POA: Insufficient documentation

## 2022-02-20 ENCOUNTER — Inpatient Hospital Stay: Admission: RE | Admit: 2022-02-20 | Payer: Self-pay | Source: Ambulatory Visit

## 2022-03-06 IMAGING — MG MM DIGITAL SCREENING BILAT W/ TOMO AND CAD
8 series · 8 of 24 positions shown · non-contrast
Comparison: Previous exam(s).

CLINICAL DATA: Screening.

EXAM:
DIGITAL SCREENING BILATERAL MAMMOGRAM WITH TOMOSYNTHESIS AND CAD
TECHNIQUE: Bilateral screening digital craniocaudal and mediolateral oblique
mammograms were obtained. Bilateral screening digital breast
tomosynthesis was performed. The images were evaluated with
computer-aided detection.

[L CC synth-2D]
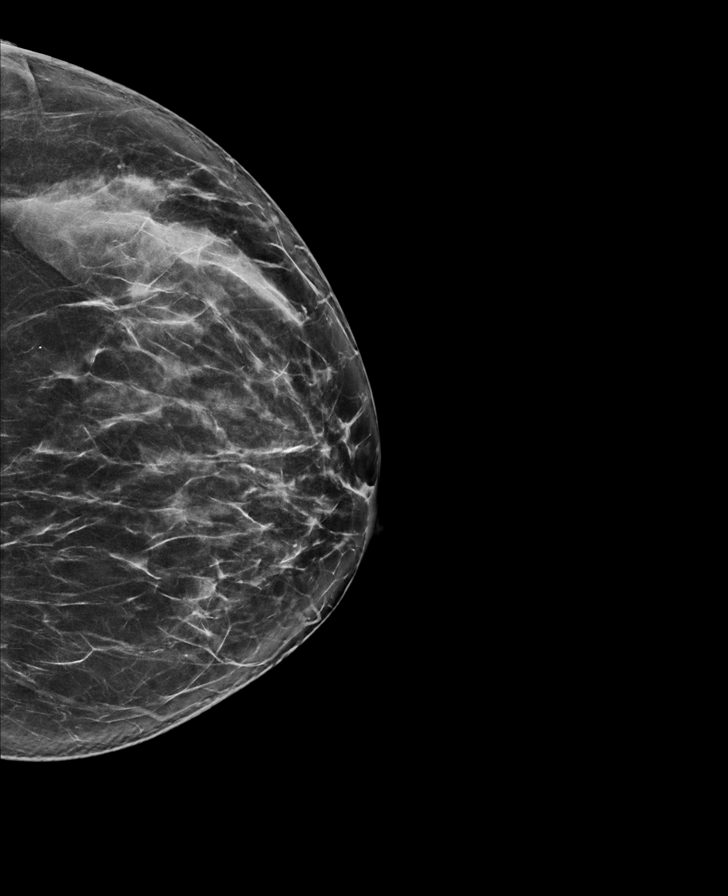

[L MLO synth-2D]
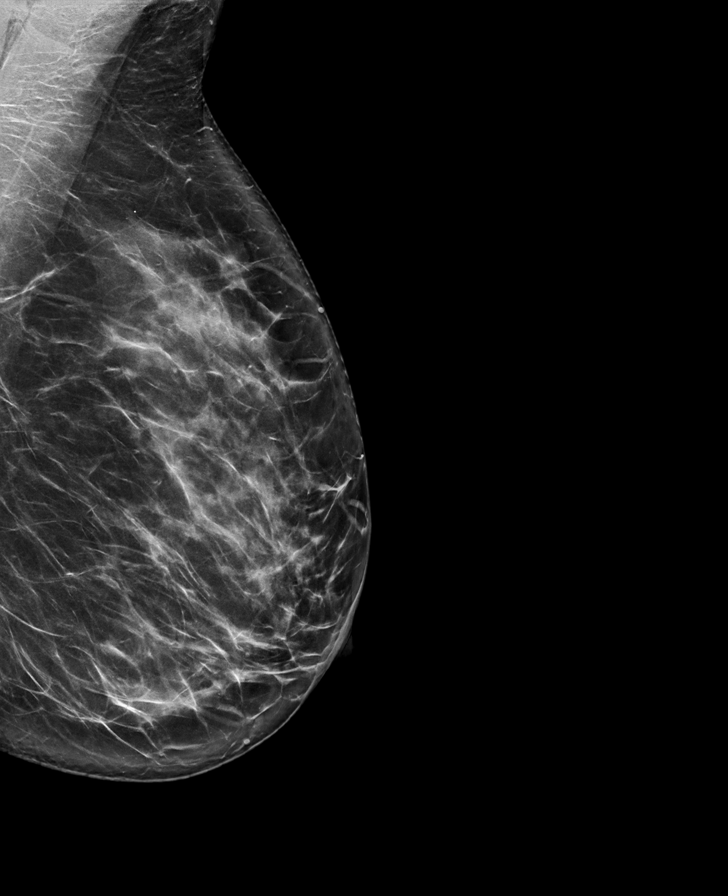

[R MLO synth-2D]
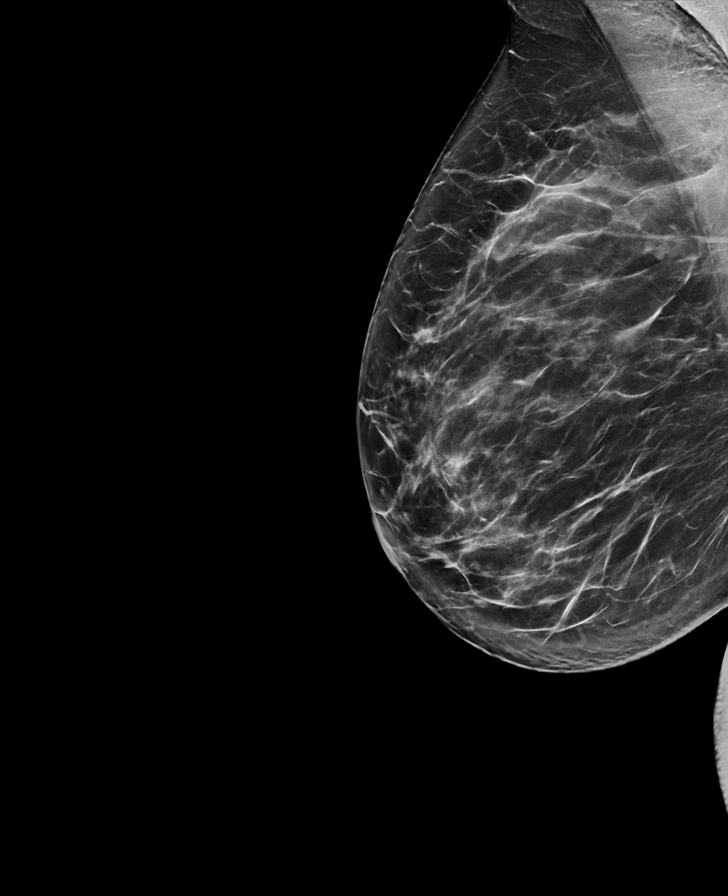

[R CC synth-2D]
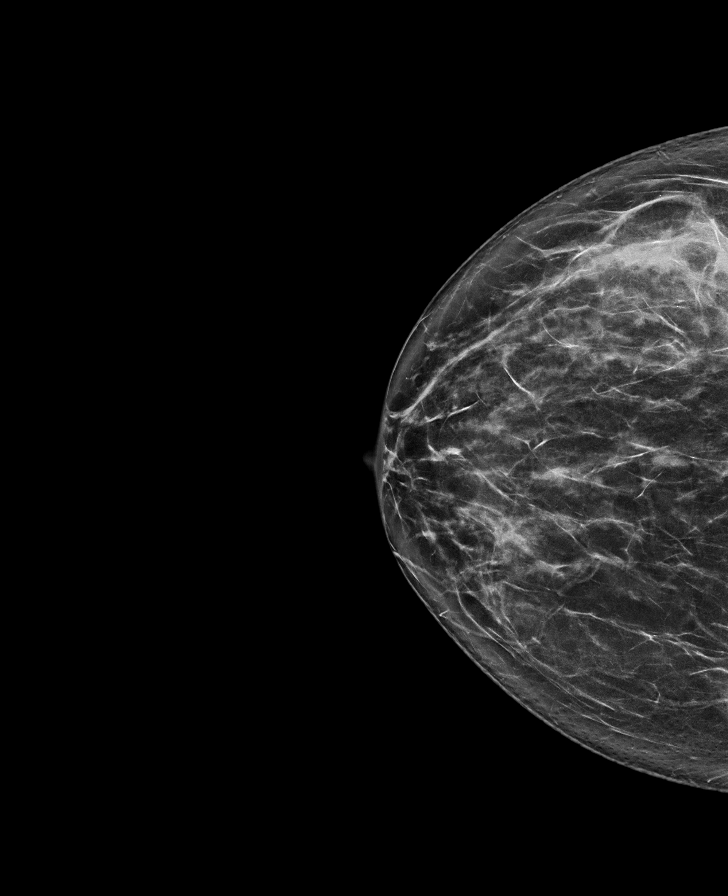

[L MLO tomo · tomo slice 44/87.0]
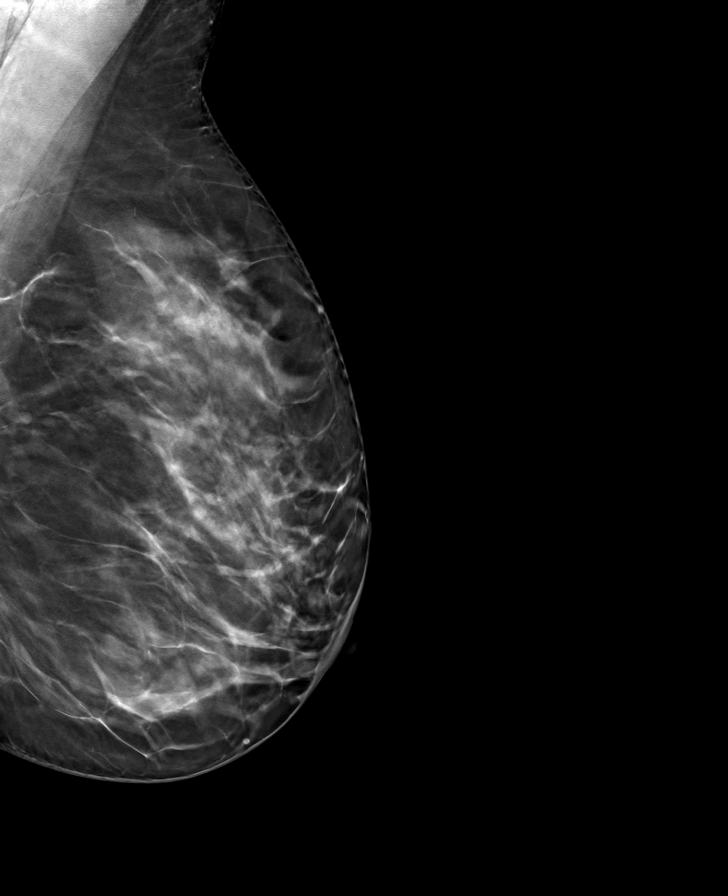

[R MLO tomo · tomo slice 44/87.0]
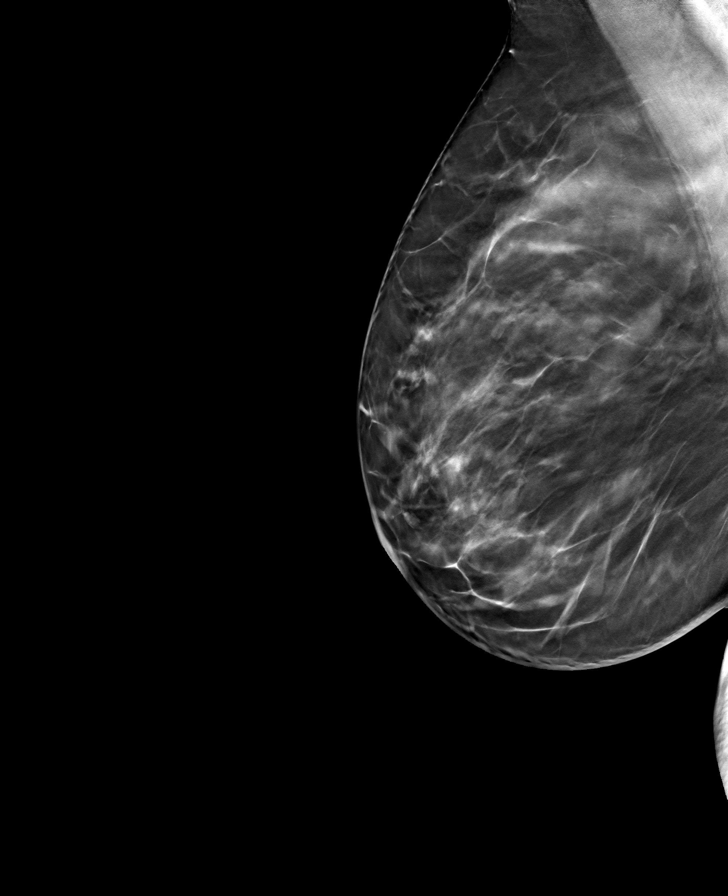

[R CC tomo · tomo slice 41/81.0]
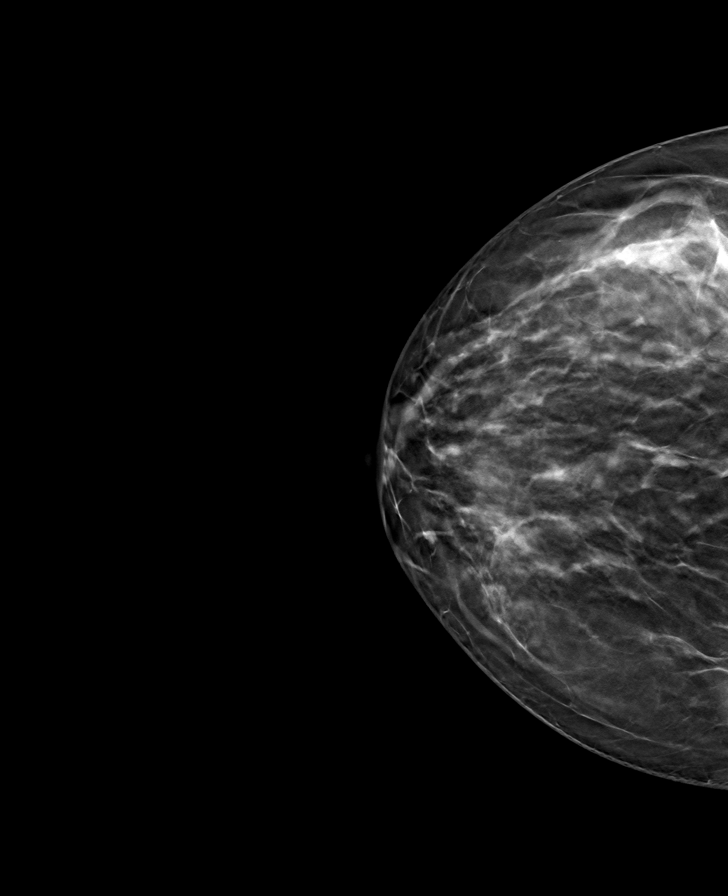

[L CC tomo · tomo slice 42/83.0]
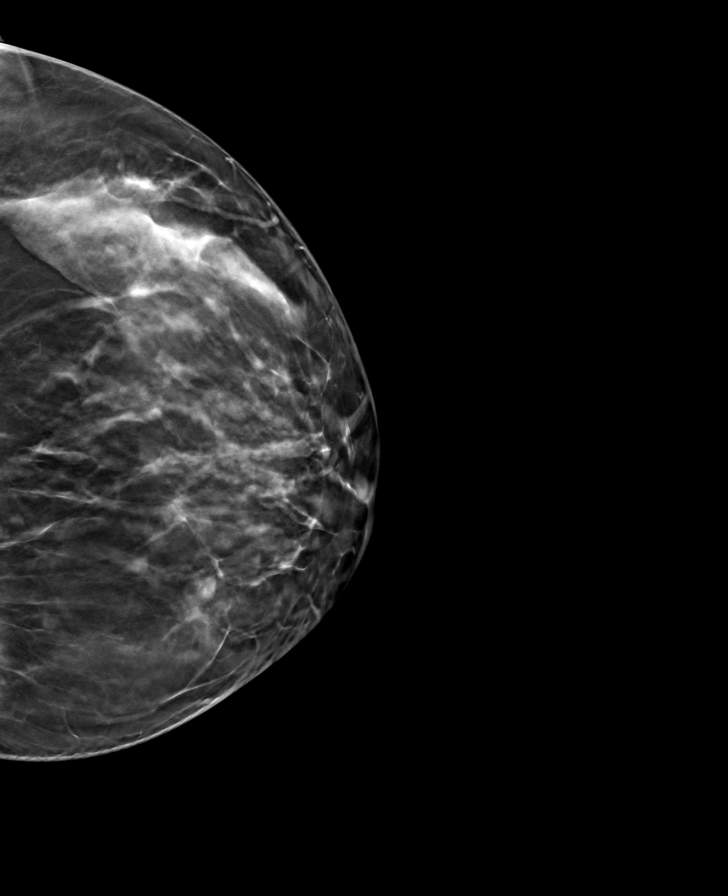

[8 of 24 positions shown; findings below may reference images not displayed]

ACR Breast Density Category b: There are scattered areas of
fibroglandular density.
FINDINGS: There are no findings suspicious for malignancy. The images were
evaluated with computer-aided detection.
IMPRESSION: No mammographic evidence of malignancy. A result letter of this
screening mammogram will be mailed directly to the patient.

RECOMMENDATION:
Screening mammogram in one year. (Code:WJ-[id number])

BI-RADS CATEGORY  1: Negative.

## 2023-01-30 ENCOUNTER — Other Ambulatory Visit: Payer: Self-pay

## 2023-01-30 DIAGNOSIS — R7309 Other abnormal glucose: Secondary | ICD-10-CM

## 2023-01-30 DIAGNOSIS — E7800 Pure hypercholesterolemia, unspecified: Secondary | ICD-10-CM

## 2023-01-30 DIAGNOSIS — D509 Iron deficiency anemia, unspecified: Secondary | ICD-10-CM

## 2023-01-31 ENCOUNTER — Other Ambulatory Visit: Payer: No Typology Code available for payment source

## 2023-02-01 LAB — CBC WITH DIFFERENTIAL/PLATELET
Absolute Monocytes: 431 cells/uL (ref 200–950)
Basophils Absolute: 20 cells/uL (ref 0–200)
Basophils Relative: 0.4 %
Eosinophils Absolute: 49 cells/uL (ref 15–500)
Eosinophils Relative: 1 %
HCT: 40.5 % (ref 35.0–45.0)
Hemoglobin: 12.7 g/dL (ref 11.7–15.5)
Lymphs Abs: 1823 cells/uL (ref 850–3900)
MCH: 26 pg — ABNORMAL LOW (ref 27.0–33.0)
MCHC: 31.4 g/dL — ABNORMAL LOW (ref 32.0–36.0)
MCV: 82.8 fL (ref 80.0–100.0)
MPV: 9.7 fL (ref 7.5–12.5)
Monocytes Relative: 8.8 %
Neutro Abs: 2577 cells/uL (ref 1500–7800)
Neutrophils Relative %: 52.6 %
Platelets: 350 10*3/uL (ref 140–400)
RBC: 4.89 10*6/uL (ref 3.80–5.10)
RDW: 13.4 % (ref 11.0–15.0)
Total Lymphocyte: 37.2 %
WBC: 4.9 10*3/uL (ref 3.8–10.8)

## 2023-02-01 LAB — COMPREHENSIVE METABOLIC PANEL
AG Ratio: 1.5 (calc) (ref 1.0–2.5)
ALT: 16 U/L (ref 6–29)
AST: 16 U/L (ref 10–35)
Albumin: 4.4 g/dL (ref 3.6–5.1)
Alkaline phosphatase (APISO): 76 U/L (ref 31–125)
BUN: 17 mg/dL (ref 7–25)
CO2: 27 mmol/L (ref 20–32)
Calcium: 9.5 mg/dL (ref 8.6–10.2)
Chloride: 102 mmol/L (ref 98–110)
Creat: 0.74 mg/dL (ref 0.50–0.99)
Globulin: 2.9 g/dL (calc) (ref 1.9–3.7)
Glucose, Bld: 95 mg/dL (ref 65–99)
Potassium: 4.2 mmol/L (ref 3.5–5.3)
Sodium: 136 mmol/L (ref 135–146)
Total Bilirubin: 0.7 mg/dL (ref 0.2–1.2)
Total Protein: 7.3 g/dL (ref 6.1–8.1)

## 2023-02-01 LAB — LIPID PANEL
Cholesterol: 223 mg/dL — ABNORMAL HIGH (ref <200)
HDL: 59 mg/dL (ref >=50)
LDL Cholesterol (Calc): 150 mg/dL (calc) — ABNORMAL HIGH
Non-HDL Cholesterol (Calc): 164 mg/dL (calc) — ABNORMAL HIGH (ref <130)
Total CHOL/HDL Ratio: 3.8 (calc) (ref <5.0)
Triglycerides: 52 mg/dL (ref <150)

## 2023-02-01 LAB — HEMOGLOBIN A1C
Hgb A1c MFr Bld: 6.1 % of total Hgb — ABNORMAL HIGH (ref <5.7)
Mean Plasma Glucose: 128 mg/dL
eAG (mmol/L): 7.1 mmol/L

## 2023-02-01 LAB — TSH: TSH: 1.17 mIU/L

## 2023-02-04 ENCOUNTER — Emergency Department
Admission: EM | Admit: 2023-02-04 | Discharge: 2023-02-04 | Disposition: A | Discharge status: Home / Self Care | Payer: No Typology Code available for payment source | Attending: Emergency Medicine | Admitting: Emergency Medicine

## 2023-02-04 ENCOUNTER — Other Ambulatory Visit: Payer: Self-pay

## 2023-02-04 ENCOUNTER — Emergency Department: Payer: No Typology Code available for payment source

## 2023-02-04 DIAGNOSIS — S0990XA Unspecified injury of head, initial encounter: Secondary | ICD-10-CM | POA: Diagnosis present

## 2023-02-04 DIAGNOSIS — Y9259 Other trade areas as the place of occurrence of the external cause: Secondary | ICD-10-CM | POA: Diagnosis not present

## 2023-02-04 DIAGNOSIS — W208XXA Other cause of strike by thrown, projected or falling object, initial encounter: Secondary | ICD-10-CM | POA: Insufficient documentation

## 2023-02-04 MED ORDER — TRAMADOL HCL 50 MG PO TABS: 50.0000 mg | ORAL_TABLET | Freq: Four times a day (QID) | ORAL | 0 refills | Status: DC | PRN
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
## 2023-02-04 NOTE — Discharge Instructions (Signed)
Continue to take Tylenol and ibuprofen.  Tramadol for pain not controlled by these medications.  Apply ice to the area of concern.  Your CT did not show a skull fracture or bleeding in the brain.  Your C-spine CT did not show a fracture. Return to the emergency department if worsening.  See your regular doctor if not improving in 3 to 4 days.
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
## 2023-02-05 ENCOUNTER — Telehealth: Payer: Self-pay

## 2023-02-05 NOTE — Telephone Encounter (Signed)
I called the patient and followed up on the nausea symptoms. She informed me that she woke up feeling nauseated before taking the Tramadol. I informed her that the provider recommended that she return to the ER or urgent care for evaluation of possibly a concussion from the head injury. The patient verbalize understanding.

## 2023-02-05 NOTE — Telephone Encounter (Signed)
Is she having nausea because of the pain medication or nausea from the head injury. If so, would be concerning for concussion and she may need to follow back up at [facility] or ER.

## 2023-02-05 NOTE — Transitions of Care (Post Inpatient/ED Visit) (Signed)
   02/05/2023  Name: Anne Weeks MRN: [id number] DOB: 1970-10-14  Today's TOC FU Call Status: Today's TOC FU Call Status:: Successful TOC FU Call Competed TOC FU Call Complete Date: 02/05/23  Transition Care Management Follow-up Telephone Call Date of Discharge: 02/04/23 Discharge Facility: [facility]) Type of Discharge: Emergency Department How have you been since you were released from the hospital?: Same Any questions or concerns?: Yes Patient Questions/Concerns:: Nausea  Items Reviewed: Did you receive and understand the discharge instructions provided?: Yes Medications obtained,verified, and reconciled?: Yes (Medications Reviewed) Any new allergies since your discharge?: No Dietary orders reviewed?: NA  Medications Reviewed Today: Medications Reviewed Today     Reviewed by Marilynn Rail, Paramedic (Paramedic) on 02/04/23 at 1036  Med List Status: <None>   Medication Order Taking? Sig Documenting Provider Last Dose Status Informant  levonorgestrel (MIRENA) 20 MCG/DAY IUD [id number]  1 each by Intrauterine route once. [provider]  Active   Multiple Vitamin (MULTIVITAMIN) tablet [id number]  Take 1 tablet by mouth daily. [provider]  Active   Omega-3 Fatty Acids (FISH OIL) 1000 MG CAPS [id number]  Take 1,000 mg by mouth daily. [provider]  Active             Home Care and Equipment/Supplies: Were Home Health Services Ordered?: NA Any new equipment or medical supplies ordered?: NA  Functional Questionnaire: Do you need assistance with bathing/showering or dressing?: No Do you need assistance with meal preparation?: No Do you need assistance with eating?: No Do you have difficulty maintaining continence: No Do you need assistance with getting out of bed/getting out of a chair/moving?: No Do you have difficulty managing or taking your medications?: No  Follow up appointments reviewed: PCP  Follow-up appointment confirmed?: No Specialist Hospital Follow-up appointment confirmed?: No Do you need transportation to your follow-up appointment?: No Do you understand care options if your condition(s) worsen?: Yes-patient verbalized understanding    Oneal Grout, [facility]) Lovie Macadamia [phone]

## 2023-02-07 ENCOUNTER — Ambulatory Visit (INDEPENDENT_AMBULATORY_CARE_PROVIDER_SITE_OTHER): Payer: No Typology Code available for payment source | Admitting: Family Medicine

## 2023-02-07 ENCOUNTER — Encounter: Payer: Self-pay | Admitting: Family Medicine

## 2023-02-07 VITALS — BP 108/64 | HR 76 | Temp 98.7°F | Ht 69.0 in | Wt 198.6 lb

## 2023-02-07 DIAGNOSIS — R7309 Other abnormal glucose: Secondary | ICD-10-CM | POA: Diagnosis not present

## 2023-02-07 DIAGNOSIS — E7800 Pure hypercholesterolemia, unspecified: Secondary | ICD-10-CM | POA: Diagnosis not present

## 2023-02-07 DIAGNOSIS — Z0000 Encounter for general adult medical examination without abnormal findings: Secondary | ICD-10-CM

## 2023-02-07 DIAGNOSIS — H1031 Unspecified acute conjunctivitis, right eye: Secondary | ICD-10-CM

## 2023-02-07 DIAGNOSIS — Z1231 Encounter for screening mammogram for malignant neoplasm of breast: Secondary | ICD-10-CM

## 2023-02-07 DIAGNOSIS — D509 Iron deficiency anemia, unspecified: Secondary | ICD-10-CM

## 2023-02-07 MED ORDER — POLYMYXIN B-TRIMETHOPRIM 10000-0.1 UNIT/ML-% OP SOLN
1.0000 [drp] | Freq: Four times a day (QID) | OPHTHALMIC | 0 refills | Status: DC
Start: 2023-02-07 — End: 2024-02-13
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
## 2023-02-07 NOTE — Patient Instructions (Addendum)
Thank you for coming to the office today.  Let me know once you turn 50+ we can refer you to [facility] for Colonoscopy.  Reviewed the Head / Neck CT Scan. No acute or traumatic injury. Superficial swelling and bruising will take time to heal. 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  Okay to take Ibuprofen 200 x 2-3 tablets as needed for headache and Tylenol.  For Mammogram screening for breast cancer   Call the Imaging Center below anytime to schedule your own appointment now that order has been placed.  [facility] [address], [address] [place], [place] [address] Phone: [phone]  DUE for FASTING BLOOD WORK (no food or drink after midnight before the lab appointment, only water or coffee without cream/sugar on the morning of)  SCHEDULE "Lab Only" visit in the morning at the clinic for lab draw in 1 YEAR  - Make sure Lab Only appointment is at about 1 week before your next appointment, so that results will be available  For Lab Results, once available within 2-3 days of blood draw, you can can log in to MyChart online to view your results and a brief explanation. Also, we can discuss results at next follow-up visit.    Please schedule a Follow-up Appointment to: Return in about 1 year (around 02/07/2024) for 1 year fasting lab only then 1 week later Annual Physical (Weds AM).  If you have any other questions or concerns, please feel free to call the office or send a message through MyChart. You may also schedule an earlier appointment if necessary.  Additionally, you may be receiving a survey about your experience at our office within a few days to 1 week by e-mail or mail. We value your feedback.  Saralyn Pilar, DO [facility], [place]

## 2023-02-08 NOTE — Assessment & Plan Note (Signed)
Mild elevated A1c to 6.1  Plan:  1. Not on any therapy currently  2. Encourage improved lifestyle - low carb, low sugar diet, reduce portion size, continue improving regular exercise 3. Follow-up 1 yr

## 2024-01-29 ENCOUNTER — Other Ambulatory Visit: Payer: Self-pay | Admitting: Family Medicine

## 2024-01-29 DIAGNOSIS — Z0000 Encounter for general adult medical examination without abnormal findings: Secondary | ICD-10-CM

## 2024-01-29 DIAGNOSIS — R7309 Other abnormal glucose: Secondary | ICD-10-CM

## 2024-01-29 DIAGNOSIS — E7800 Pure hypercholesterolemia, unspecified: Secondary | ICD-10-CM

## 2024-01-29 DIAGNOSIS — D509 Iron deficiency anemia, unspecified: Secondary | ICD-10-CM

## 2024-01-30 ENCOUNTER — Other Ambulatory Visit: Payer: Self-pay

## 2024-01-31 LAB — LIPID PANEL
Cholesterol: 198 mg/dL (ref <200)
HDL: 68 mg/dL (ref >=50)
LDL Cholesterol (Calc): 116 mg/dL — ABNORMAL HIGH
Non-HDL Cholesterol (Calc): 130 mg/dL — ABNORMAL HIGH (ref <130)
Total CHOL/HDL Ratio: 2.9 (calc) (ref <5.0)
Triglycerides: 46 mg/dL (ref <150)

## 2024-01-31 LAB — COMPREHENSIVE METABOLIC PANEL WITH GFR
AG Ratio: 1.6 (calc) (ref 1.0–2.5)
ALT: 17 U/L (ref 6–29)
AST: 18 U/L (ref 10–35)
Albumin: 4.4 g/dL (ref 3.6–5.1)
Alkaline phosphatase (APISO): 72 U/L (ref 37–153)
BUN: 13 mg/dL (ref 7–25)
CO2: 30 mmol/L (ref 20–32)
Calcium: 9.8 mg/dL (ref 8.6–10.4)
Chloride: 103 mmol/L (ref 98–110)
Creat: 0.75 mg/dL (ref 0.50–1.03)
Globulin: 2.8 g/dL (ref 1.9–3.7)
Glucose, Bld: 91 mg/dL (ref 65–99)
Potassium: 4.5 mmol/L (ref 3.5–5.3)
Sodium: 140 mmol/L (ref 135–146)
Total Bilirubin: 0.5 mg/dL (ref 0.2–1.2)
Total Protein: 7.2 g/dL (ref 6.1–8.1)
eGFR: 97 mL/min/{1.73_m2} (ref >=60)

## 2024-01-31 LAB — CBC WITH DIFFERENTIAL/PLATELET
Absolute Lymphocytes: 1800 {cells}/uL (ref 850–3900)
Absolute Monocytes: 381 {cells}/uL (ref 200–950)
Basophils Absolute: 21 {cells}/uL (ref 0–200)
Basophils Relative: 0.5 %
Eosinophils Absolute: 49 {cells}/uL (ref 15–500)
Eosinophils Relative: 1.2 %
HCT: 40.3 % (ref 35.0–45.0)
Hemoglobin: 12.2 g/dL (ref 11.7–15.5)
MCH: 25.5 pg — ABNORMAL LOW (ref 27.0–33.0)
MCHC: 30.3 g/dL — ABNORMAL LOW (ref 32.0–36.0)
MCV: 84.3 fL (ref 80.0–100.0)
MPV: 9.9 fL (ref 7.5–12.5)
Monocytes Relative: 9.3 %
Neutro Abs: 1849 {cells}/uL (ref 1500–7800)
Neutrophils Relative %: 45.1 %
Platelets: 301 10*3/uL (ref 140–400)
RBC: 4.78 10*6/uL (ref 3.80–5.10)
RDW: 13.3 % (ref 11.0–15.0)
Total Lymphocyte: 43.9 %
WBC: 4.1 10*3/uL (ref 3.8–10.8)

## 2024-01-31 LAB — HEMOGLOBIN A1C
Hgb A1c MFr Bld: 5.9 % — ABNORMAL HIGH (ref <5.7)
Mean Plasma Glucose: 123 mg/dL
eAG (mmol/L): 6.8 mmol/L

## 2024-01-31 LAB — TSH: TSH: 1.76 m[IU]/L

## 2024-02-13 ENCOUNTER — Ambulatory Visit (INDEPENDENT_AMBULATORY_CARE_PROVIDER_SITE_OTHER): Payer: Self-pay | Admitting: Family Medicine

## 2024-02-13 ENCOUNTER — Encounter: Payer: Self-pay | Admitting: Family Medicine

## 2024-02-13 VITALS — BP 124/80 | HR 71 | Ht 69.0 in | Wt 186.2 lb

## 2024-02-13 DIAGNOSIS — Z1231 Encounter for screening mammogram for malignant neoplasm of breast: Secondary | ICD-10-CM

## 2024-02-13 DIAGNOSIS — Z0000 Encounter for general adult medical examination without abnormal findings: Secondary | ICD-10-CM

## 2024-02-13 DIAGNOSIS — E7800 Pure hypercholesterolemia, unspecified: Secondary | ICD-10-CM

## 2024-02-13 DIAGNOSIS — R7309 Other abnormal glucose: Secondary | ICD-10-CM | POA: Diagnosis not present

## 2024-02-13 DIAGNOSIS — Z1211 Encounter for screening for malignant neoplasm of colon: Secondary | ICD-10-CM

## 2024-02-13 NOTE — Progress Notes (Signed)
 Subjective:    Patient ID: Anne Weeks, female    DOB: July 21, 1971, 53 y.o.   MRN: [id number]  Anne Weeks is a 53 y.o. female presenting on 02/13/2024 for Annual Exam   HPI  Discussed the use of AI scribe software for clinical note transcription with the patient, who gave verbal consent to proceed.  History of Present Illness   Anne Weeks is a 53 year old female who presents for an annual physical exam.  Metabolic and lipid parameters - Recent blood work included kidney and liver function tests, and blood glucose levels - LDL cholesterol decreased from 150 to 116 - HDL cholesterol is 68 - Attributes improvements in blood sugar and cholesterol to dietary changes  Gynecologic health - Mirena  IUD in place since November - No reported issues with IUD  Supplement use - Continues to take a multivitamin       HYPERLIPIDEMIA: - Reports concerns. Last lipid panel 2025 elevated LDL 116, prior range 150+ - Currently taking Omega 3, tolerating well without side effects or myalgias    Elevated A1c / Pre Diabetes. - Hemoglobin A1c decreased from 6.1 to 5.9 over the past year    Cancer screening status - No mammogram since 2022 due to insurance coverage issues; plans to schedule with new insurance - No colon cancer screening completed; considering Cologuard test  Immunization status - Received COVID vaccine earlier this year - Received [facility]anus vaccine in March [facility] - Completed shingles vaccine at [facility]; exact date unknown  Request copy of Shingrix vaccine from [facility] from patient.      02/13/2024    7:57 AM 02/07/2023    9:33 AM 12/29/2020    9:02 AM  Depression screen PHQ 2/9  Decreased Interest 0 0 0  Down, Depressed, Hopeless 0 0 0  PHQ - 2 Score 0 0 0  Altered sleeping 0 0 0  Tired, decreased energy 0 0 0  Change in appetite 0 0 0  Feeling bad or failure about yourself  0 0 0  Trouble concentrating 0 0 0  Moving slowly or fidgety/restless 0 0 0   Suicidal thoughts 0 0 0  PHQ-9 Score 0 0 0  Difficult doing work/chores  Not difficult at all        02/13/2024    7:57 AM 02/07/2023    9:33 AM 09/17/2020    8:31 AM  GAD 7 : Generalized Anxiety Score  Nervous, Anxious, on Edge 0 0 0  Control/stop worrying 0 0 0  Worry too much - different things 0 0 0  Trouble relaxing 0 0 0  Restless 0 0 0  Easily annoyed or irritable 0 0 0  Afraid - awful might happen 0 0 0  Total GAD 7 Score 0 0 0  Anxiety Difficulty  Not difficult at all Not difficult at all     Past Medical History:  Diagnosis Date   Chronic cystitis    History reviewed. No pertinent surgical history. Social History   Socioeconomic History   Marital status: Married    Spouse name: Not on file   Number of children: Not on file   Years of education: Not on file   Highest education level: Not on file  Occupational History   Not on file  Tobacco Use   Smoking status: Never   Smokeless tobacco: Never  Vaping Use   Vaping status: Never Used  Substance and Sexual Activity   Alcohol use: Yes  Alcohol/week: 1.0 standard drink of alcohol    Types: 1 Standard drinks or equivalent per week    Comment: rarely   Drug use: No   Sexual activity: Yes    Birth control/protection: I.U.D.    Comment: Mirena   Other Topics Concern   Not on file  Social History Narrative   Born and raised in [place], moved to [place]  recently in 2018 to be with husband   Social Drivers of Corporate investment banker Strain: Patient Declined (02/12/2024)   Overall Financial Resource Strain (CARDIA)    Difficulty of Paying Living Expenses: Patient declined  Food Insecurity: Patient Declined (02/12/2024)   Hunger Vital Sign    Worried About Running Out of Food in the Last Year: Patient declined    Ran Out of Food in the Last Year: Patient declined  Transportation Needs: Patient Declined (02/12/2024)   PRAPARE - Administrator, Civil Service (Medical): Patient declined    Lack of  Transportation (Non-Medical): Patient declined  Physical Activity: Unknown (02/12/2024)   Exercise Vital Sign    Days of Exercise per Week: Patient declined    Minutes of Exercise per Session: Not on file  Stress: No Stress Concern Present (02/12/2024)   [employer] of Occupational Health - Occupational Stress Questionnaire    Feeling of Stress: Not at all  Social Connections: Unknown (02/12/2024)   Social Connection and Isolation Panel    Frequency of Communication with Friends and Family: Patient declined    Frequency of Social Gatherings with Friends and Family: Patient declined    Attends Religious Services: Patient declined    Database administrator or Organizations: Patient declined    Attends Engineer, structural: Not on file    Marital Status: Patient declined  Catering manager Violence: Not on file   Family History  Problem Relation Age of Onset   Diabetes Mother    Hypertension Mother    Cancer Father 68       lung   Diabetes Sister    Breast cancer Maternal Grandmother    Bladder Cancer Neg Hx    Kidney cancer Neg Hx    Current Outpatient Medications on File Prior to Visit  Medication Sig   levonorgestrel  (MIRENA ) 20 MCG/DAY IUD 1 each by Intrauterine route once.   Multiple Vitamin (MULTIVITAMIN) tablet Take 1 tablet by mouth daily. (Patient not taking: Reported on 02/07/2023)   No current facility-administered medications on file prior to visit.    Review of Systems  Constitutional:  Negative for activity change, appetite change, chills, diaphoresis, fatigue and fever.  HENT:  Negative for congestion and hearing loss.   Eyes:  Negative for visual disturbance.  Respiratory:  Negative for cough, chest tightness, shortness of breath and wheezing.   Cardiovascular:  Negative for chest pain, palpitations and leg swelling.  Gastrointestinal:  Negative for abdominal pain, constipation, diarrhea, nausea and vomiting.  Genitourinary:  Negative for dysuria,  frequency and hematuria.  Musculoskeletal:  Negative for arthralgias and neck pain.  Skin:  Negative for rash.  Neurological:  Negative for dizziness, weakness, light-headedness, numbness and headaches.  Hematological:  Negative for adenopathy.  Psychiatric/Behavioral:  Negative for behavioral problems, dysphoric mood and sleep disturbance.    Per HPI unless specifically indicated above     Objective:    BP 124/80 (BP Location: Left Arm, Patient Position: Sitting, Cuff Size: Normal)   Pulse 71   Ht 5' 9 (1.753 m)   Wt 186 lb 4 oz (  84.5 kg)   SpO2 97%   BMI 27.50 kg/m   Wt Readings from Last 3 Encounters:  02/13/24 186 lb 4 oz (84.5 kg)  02/07/23 198 lb 9.6 oz (90.1 kg)  02/04/23 190 lb (86.2 kg)    Physical Exam Vitals and nursing note reviewed.  Constitutional:      General: She is not in acute distress.    Appearance: She is well-developed. She is not diaphoretic.     Comments: Well-appearing, comfortable, cooperative  HENT:     Head: Normocephalic and atraumatic.     Right Ear: Tympanic membrane, ear canal and external ear normal. There is no impacted cerumen.     Left Ear: Tympanic membrane, ear canal and external ear normal. There is no impacted cerumen.  Eyes:     General:        Right eye: No discharge.        Left eye: No discharge.     Conjunctiva/sclera: Conjunctivae normal.     Pupils: Pupils are equal, round, and reactive to light.  Neck:     Thyroid : No thyromegaly.     Vascular: No carotid bruit.  Cardiovascular:     Rate and Rhythm: Normal rate and regular rhythm.     Pulses: Normal pulses.     Heart sounds: Normal heart sounds. No murmur heard. Pulmonary:     Effort: Pulmonary effort is normal. No respiratory distress.     Breath sounds: Normal breath sounds. No wheezing or rales.  Abdominal:     General: Bowel sounds are normal. There is no distension.     Palpations: Abdomen is soft. There is no mass.     Tenderness: There is no abdominal  tenderness.  Musculoskeletal:        General: No tenderness. Normal range of motion.     Cervical back: Normal range of motion and neck supple.     Right lower leg: No edema.     Left lower leg: No edema.     Comments: Upper / Lower Extremities: - Normal muscle tone, strength bilateral upper extremities 5/5, lower extremities 5/5  Lymphadenopathy:     Cervical: No cervical adenopathy.  Skin:    General: Skin is warm and dry.     Findings: No erythema or rash.  Neurological:     Mental Status: She is alert and oriented to person, place, and time.     Comments: Distal sensation intact to light touch all extremities  Psychiatric:        Mood and Affect: Mood normal.        Behavior: Behavior normal.        Thought Content: Thought content normal.     Comments: Well groomed, good eye contact, normal speech and thoughts        Results for orders placed or performed in visit on 01/29/24  TSH   Collection Time: 01/30/24  7:52 AM  Result Value Ref Range   TSH 1.76 mIU/L  Lipid panel   Collection Time: 01/30/24  7:52 AM  Result Value Ref Range   Cholesterol 198 <200 mg/dL   HDL 68 > OR = 50 mg/dL   Triglycerides 46 <849 mg/dL   LDL Cholesterol (Calc) 116 (H) mg/dL (calc)   Total CHOL/HDL Ratio 2.9 <5.0 (calc)   Non-HDL Cholesterol (Calc) 130 (H) <130 mg/dL (calc)  Hemoglobin J8r   Collection Time: 01/30/24  7:52 AM  Result Value Ref Range   Hgb A1c MFr Bld 5.9 (H) <5.7 %  Mean Plasma Glucose 123 mg/dL   eAG (mmol/L) 6.8 mmol/L  CBC with Differential/Platelet   Collection Time: 01/30/24  7:52 AM  Result Value Ref Range   WBC 4.1 3.8 - 10.8 Thousand/uL   RBC 4.78 3.80 - 5.10 Million/uL   Hemoglobin 12.2 11.7 - 15.5 g/dL   HCT 59.6 64.9 - 54.9 %   MCV 84.3 80.0 - 100.0 fL   MCH 25.5 (L) 27.0 - 33.0 pg   MCHC 30.3 (L) 32.0 - 36.0 g/dL   RDW 86.6 88.9 - 84.9 %   Platelets 301 140 - 400 Thousand/uL   MPV 9.9 7.5 - 12.5 fL   Neutro Abs 1,849 1,500 - 7,800 cells/uL    Absolute Lymphocytes 1,800 850 - 3,900 cells/uL   Absolute Monocytes 381 200 - 950 cells/uL   Eosinophils Absolute 49 15 - 500 cells/uL   Basophils Absolute 21 0 - 200 cells/uL   Neutrophils Relative % 45.1 %   Total Lymphocyte 43.9 %   Monocytes Relative 9.3 %   Eosinophils Relative 1.2 %   Basophils Relative 0.5 %  Comprehensive metabolic panel with GFR   Collection Time: 01/30/24  7:52 AM  Result Value Ref Range   Glucose, Bld 91 65 - 99 mg/dL   BUN 13 7 - 25 mg/dL   Creat 9.24 9.49 - 8.96 mg/dL   eGFR 97 > OR = 60 fO/fpw/8.26f7   BUN/Creatinine Ratio SEE NOTE: 6 - 22 (calc)   Sodium 140 135 - 146 mmol/L   Potassium 4.5 3.5 - 5.3 mmol/L   Chloride 103 98 - 110 mmol/L   CO2 30 20 - 32 mmol/L   Calcium 9.8 8.6 - 10.4 mg/dL   Total Protein 7.2 6.1 - 8.1 g/dL   Albumin 4.4 3.6 - 5.1 g/dL   Globulin 2.8 1.9 - 3.7 g/dL (calc)   AG Ratio 1.6 1.0 - 2.5 (calc)   Total Bilirubin 0.5 0.2 - 1.2 mg/dL   Alkaline phosphatase (APISO) 72 37 - 153 U/L   AST 18 10 - 35 U/L   ALT 17 6 - 29 U/L      Assessment & Plan:   Problem List Items Addressed This Visit     Elevated hemoglobin A1c   Other Visit Diagnoses       Annual physical exam    -  Primary     Pure hypercholesterolemia         Encounter for screening mammogram for malignant neoplasm of breast       Relevant Orders   MM 3D SCREENING MAMMOGRAM BILATERAL BREAST     Screening for colon cancer       Relevant Orders   Cologuard        Updated Health Maintenance information Reviewed recent lab results with patient Encouraged improvement to lifestyle with diet and exercise Goal of weight loss  Prediabetes A1c improved to 5.9% from 6.1, indicating mild prediabetes. Improvement due to dietary changes.  Hyperlipidemia Cholesterol levels improved with LDL at 116 and total cholesterol under 200. Favorable HDL at 68. Lifestyle management successful.  Hypertension Blood pressure at 124/80, within normal range.  Weight  Management Lost 12 pounds due to dietary changes. Continued focus on healthy eating.  General Health Maintenance Annual physical completed. Discussed vaccinations and cancer screenings. COVID-19 vaccine optional. Tetanus and shingles vaccines completed. Mammogram pending due to insurance issues. Cologuard discussed and agreed upon. - Order mammogram. She noted that it was not covered locally previously. Discussed that it should be  in network at this point with her insurance. She will call to schedule - Order Cologuard test. - Provide documentation of shingles vaccine. - Schedule next annual physical in one year.        Orders Placed This Encounter  Procedures   MM 3D SCREENING MAMMOGRAM BILATERAL BREAST    Standing Status:   Future    Expiration Date:   02/12/2025    Reason for Exam (SYMPTOM  OR DIAGNOSIS REQUIRED):   Screening bilateral 3D Mammogram Tomo    Preferred imaging location?:   [facility]   Cologuard    No orders of the defined types were placed in this encounter.    Follow up plan: Return for 1 year fasting lab > 1 week later Annual Physical.  Future labs 02/2025  Marsa Officer, DO [facility] 02/13/2024, 8:08 AM

## 2024-02-13 NOTE — Patient Instructions (Addendum)
 Thank you for coming to the office today.  Please send photo of Shingrix vaccines.  For Mammogram screening for breast cancer   Call the Imaging Center below anytime to schedule your own appointment now that order has been placed.  [facility] [address], [address] [place], [place] [address] Phone: [phone]  -----------------------------------------------  Colon Cancer Screening: Ordered the Cologuard (home kit) test for colon cancer screening. Stay tuned for further updates.  It will be shipped to you directly. If not received in 2-4 weeks, call us  or the company.   If you send it back and no results are received in 2-4 weeks, call us  or the company as well!   Colon Cancer Screening: - For all adults age 1+ routine colon cancer screening is highly recommended.     - Recent guidelines from American Cancer Society recommend starting age of 53 - Early detection of colon cancer is important, because often there are no warning signs or symptoms, also if found early usually it can be cured. Late stage is hard to treat.   - If Cologuard is NEGATIVE, then it is good for 3 years before next due - If Cologuard is POSITIVE, then it is strongly advised to get a Colonoscopy, which allows the GI doctor to locate the source of the cancer or polyp (even very early stage) and treat it by removing it. ------------------------- Follow instructions to collect sample, you may call the company for any help or questions, 24/7 telephone support at [phone].  DUE for FASTING BLOOD WORK (no food or drink after midnight before the lab appointment, only water or coffee without cream/sugar on the morning of)  SCHEDULE Lab Only visit in the morning at the clinic for lab draw in 1 YEAR  - Make sure Lab Only appointment is at about 1 week before your next appointment, so that results will be available  For Lab Results, once  available within 2-3 days of blood draw, you can can log in to MyChart online to view your results and a brief explanation. Also, we can discuss results at next follow-up visit.   Please schedule a Follow-up Appointment to: Return for 1 year fasting lab > 1 week later Annual Physical.  If you have any other questions or concerns, please feel free to call the office or send a message through MyChart. You may also schedule an earlier appointment if necessary.  Additionally, you may be receiving a survey about your experience at our office within a few days to 1 week by e-mail or mail. We value your feedback.  Marsa Officer, DO [facility], [place]

## 2024-02-14 ENCOUNTER — Encounter: Payer: Self-pay | Admitting: Family Medicine

## 2024-03-06 LAB — COLOGUARD: COLOGUARD: NEGATIVE

## 2024-03-07 ENCOUNTER — Ambulatory Visit: Payer: Self-pay | Admitting: Internal Medicine

## 2024-05-21 DIAGNOSIS — Z01419 Encounter for gynecological examination (general) (routine) without abnormal findings: Secondary | ICD-10-CM | POA: Diagnosis not present

## 2024-06-10 ENCOUNTER — Other Ambulatory Visit (HOSPITAL_COMMUNITY): Payer: Self-pay

## 2024-06-10 MED ORDER — FLUZONE 0.5 ML IM SUSY
0.5000 mL | PREFILLED_SYRINGE | Freq: Once | INTRAMUSCULAR | 0 refills | Status: AC
  Filled 2024-06-10: qty 0.5, 1d supply, fill #0

## 2024-06-19 LAB — HM MAMMOGRAPHY

## 2024-06-20 ENCOUNTER — Encounter: Payer: Self-pay | Admitting: Family Medicine

## 2024-06-23 DIAGNOSIS — R928 Other abnormal and inconclusive findings on diagnostic imaging of breast: Secondary | ICD-10-CM | POA: Diagnosis not present

## 2024-06-23 DIAGNOSIS — N6001 Solitary cyst of right breast: Secondary | ICD-10-CM | POA: Diagnosis not present

## 2024-06-24 ENCOUNTER — Encounter: Payer: Self-pay | Admitting: Family Medicine

## 2024-06-25 DIAGNOSIS — Z30432 Encounter for removal of intrauterine contraceptive device: Secondary | ICD-10-CM | POA: Diagnosis not present

## 2025-02-18 ENCOUNTER — Other Ambulatory Visit

## 2025-02-25 ENCOUNTER — Encounter: Admitting: Family Medicine
